# Patient Record
Sex: Female | Born: 1970 | Race: White | Hispanic: No | Marital: Married | Smoking: Former smoker
Health system: Southern US, Community
[De-identification: ages and names within clinical notes are randomized; demographics above are authoritative.]

## PROBLEM LIST (undated history)

## (undated) DIAGNOSIS — G709 Myoneural disorder, unspecified: Secondary | ICD-10-CM

## (undated) DIAGNOSIS — I499 Cardiac arrhythmia, unspecified: Secondary | ICD-10-CM

## (undated) DIAGNOSIS — R002 Palpitations: Secondary | ICD-10-CM

## (undated) DIAGNOSIS — N3020 Other chronic cystitis without hematuria: Secondary | ICD-10-CM

## (undated) DIAGNOSIS — Z5189 Encounter for other specified aftercare: Secondary | ICD-10-CM

## (undated) DIAGNOSIS — M1990 Unspecified osteoarthritis, unspecified site: Secondary | ICD-10-CM

## (undated) DIAGNOSIS — G47419 Narcolepsy without cataplexy: Secondary | ICD-10-CM

## (undated) DIAGNOSIS — M797 Fibromyalgia: Secondary | ICD-10-CM

## (undated) DIAGNOSIS — M5126 Other intervertebral disc displacement, lumbar region: Secondary | ICD-10-CM

## (undated) DIAGNOSIS — F419 Anxiety disorder, unspecified: Secondary | ICD-10-CM

## (undated) HISTORY — DX: Encounter for other specified aftercare: Z51.89

## (undated) HISTORY — PX: FRACTURE SURGERY: SHX138

## (undated) HISTORY — PX: LASIK: SHX215

## (undated) HISTORY — DX: Anxiety disorder, unspecified: F41.9

## (undated) HISTORY — DX: Other intervertebral disc displacement, lumbar region: M51.26

## (undated) HISTORY — DX: Narcolepsy without cataplexy: G47.419

## (undated) HISTORY — PX: AUGMENTATION MAMMAPLASTY: SUR837

## (undated) HISTORY — DX: Fibromyalgia: M79.7

## (undated) HISTORY — PX: TONSILLECTOMY: SUR1361

## (undated) HISTORY — DX: Myoneural disorder, unspecified: G70.9

## (undated) HISTORY — PX: COLONOSCOPY: SHX174

## (undated) HISTORY — DX: Palpitations: R00.2

## (undated) HISTORY — DX: Other chronic cystitis without hematuria: N30.20

---

## 2006-02-14 HISTORY — PX: AUGMENTATION MAMMAPLASTY: SUR837

## 2014-09-22 ENCOUNTER — Encounter (INDEPENDENT_AMBULATORY_CARE_PROVIDER_SITE_OTHER): Payer: Self-pay | Admitting: Neurology

## 2014-09-22 ENCOUNTER — Ambulatory Visit (INDEPENDENT_AMBULATORY_CARE_PROVIDER_SITE_OTHER): Admitting: Neurology

## 2014-09-22 ENCOUNTER — Encounter: Payer: Self-pay | Admitting: Hospital

## 2014-09-22 VITALS — BP 123/80 | HR 72 | Ht 63.8 in | Wt 114.0 lb

## 2014-09-22 DIAGNOSIS — G4711 Idiopathic hypersomnia with long sleep time: Principal | ICD-10-CM

## 2014-09-22 MED ORDER — SODIUM OXYBATE 500 MG/ML OR SOLN
3.75 mg | ORAL | Status: DC
Start: ? — End: 2014-09-22

## 2014-09-22 MED ORDER — SODIUM OXYBATE 500 MG/ML OR SOLN
3.7500 mg | Freq: Two times a day (BID) | ORAL | 5 refills | Status: DC
Start: 2014-09-22 — End: 2014-11-27

## 2014-09-22 MED ORDER — DULOXETINE HCL 20 MG OR CPEP: 120.00 mg | ORAL_CAPSULE | Freq: Every day | ORAL | Status: AC

## 2014-09-22 NOTE — Progress Notes (Signed)
Subjective:   April Cherry is a 44 year old female who is here for Sleep Problem      HPI    The patient has idiopathic hypersomnia.  At last visit she was taking Xyrem 3.75 mg twice a day and doing well with therapy.  She reported improved sleep quality and daytime alertness with this medication.  She was diagnosed with this problem about 10 years ago.    She also has fibromyalgia and attributes her disrupted sleep properly due to pain from fibromyalgia.    No gasping, choking, apneic events at night.  No significant tiredness durring the day.  No abnormal behaviors at night.  She does have mild sleep disruption at night.  Scored 5 on ESS.    Review of Systems   Constitutional: Positive for fatigue.   Eyes: Negative for visual disturbance.   Respiratory: Negative for apnea and choking.    Genitourinary: Negative for difficulty urinating.   Hematological: Does not bruise/bleed easily.   Psychiatric/Behavioral: The patient is not hyperactive.         Current Outpatient Prescriptions   Medication Sig Dispense Refill   . DULoxetine (CYMBALTA) 20 MG capsule Take 20 mg by mouth daily.     . Sodium Oxybate (XYREM) 500 MG/ML SOLN 3.75 mg.       No current facility-administered medications for this visit.      Allergies   Allergen Reactions   . Sulfa Drugs Unspecified       Reviewed patients pertinent information related to social history, past medical, past surgical, and family history.     Objective:  Vital signs: BP 123/80  Pulse 72  Ht 5' 3.8" (1.621 m)  Wt 51.7 kg (114 lb)  LMP 09/22/2014  SpO2 96%  BMI 19.69 kg/m2    HEENT:  MP class III soft palate.  Neck:  Supple, no thyromegally  CV:  RR, no murmers noted.  Pulm:  CTA B  Neurologic Exam     Mental Status   Oriented to person, place, and time.   Attention: normal. Concentration: normal.   Speech: speech is normal   Level of consciousness: alert  Knowledge: good.   Normal comprehension.     Cranial Nerves   Cranial nerves II through XII intact.     Motor Exam          Strength   Strength 5/5 throughout.     Sensory Exam   Right arm light touch: normal  Left arm light touch: normal  Right leg light touch: normal  Left leg light touch: normal  Vibration normal.     Gait, Coordination, and Reflexes     Gait  Gait: normal    Coordination   Finger to nose coordination: normal    Tremor   Resting tremor: absent  Intention tremor: absent  Action tremor: absent    Reflexes   Right brachioradialis: 2+  Left brachioradialis: 2+  Right biceps: 2+  Left biceps: 2+  Right triceps: 2+  Left triceps: 2+  Right patellar: 2+  Left patellar: 2+  Right achilles: 2+  Left achilles: 2+  Right grip: 2+  Left grip: 2+  Right plantar: normal  Left plantar: normal      Assessment/Plan:  April Cherry was seen today for Sleep Problem   She has idiopthic hypersomnia which is well treated with Xyrem.  She reports no side effects from this medication.    I recommend the following:    1.  Continue Xyrem  3.75 gms BID  2.  Schedule an attended diagnostic sleep study to evaluate for PLMS and OSA  as she continues to have disrupted sleep.  3.  F/U with me in 6 months    Diagnosed with:    ICD-10-CM ICD-9-CM    1. Idiopathic hypersomnia G47.11 780.54      No orders of the defined types were placed in this encounter.

## 2014-09-22 NOTE — Patient Instructions (Signed)
April Cherry was seen today for Sleep Problem   She has idiopthic hypersomnia which is well treated with Xyrem.  She reports no side effects from this medication.    I recommend the following:    1.  Continue Xyrem 3.75 gms BID  2.  Schedule an attended diagnostic sleep study to evaluate for PLMS and OSA  as she continues to have disrupted sleep.  3.  F/U with me in 6 months

## 2014-09-23 ENCOUNTER — Telehealth (INDEPENDENT_AMBULATORY_CARE_PROVIDER_SITE_OTHER): Payer: Self-pay

## 2014-09-23 NOTE — Telephone Encounter (Signed)
Auth requested for sleep study and ov fu

## 2014-10-31 ENCOUNTER — Ambulatory Visit (INDEPENDENT_AMBULATORY_CARE_PROVIDER_SITE_OTHER)

## 2014-10-31 DIAGNOSIS — G4761 Periodic limb movement disorder: Secondary | ICD-10-CM

## 2014-10-31 DIAGNOSIS — G4711 Idiopathic hypersomnia with long sleep time: Principal | ICD-10-CM

## 2014-11-04 NOTE — Procedures (Signed)
 COMPREHENSIVE POLYSOMNOGRAM CLINICAL REPORT  Diagnostic Polysomnogram [phone])    Patient Data  Name: April Cherry Referring Physician: [facility]   Age: 44 y Interpreting Physician: Jerry Caras, MD   Sex: Female Height: 5\' 4"  Epworth: 5   DOB: May 13, 1970 Weight: 114.0 lbs. BMI: 19.7     Neck Size: 13 inches      Sleep Study Information  Date of Study: 10/31/2014 Acquisition Technician: A. Rochells, RPSGT   Date of Interpretation: 11/03/2014 Scoring Technician: A. Rochells, RPSGT     Study Start Time: 10:36 PM       SUMMARY AND CONCLUSIONS:    Impression:      1. This study does not show significant sleep disordered breathing or obstructive sleep apnea.  Snoring was observed, the AHI was 1.2, and the lowest desaturation was to 92.0%.     2. Sleep was fragmented with 17 brief mostly spontaneous awakenings overnight resulting in a slightly reduced sleep efficiency of 89.2%.      3. Periodic Leg Movements of Sleep (PLMS) occurred at a rate of 50.2 per hour (normal < 15/hr). The majority of these were not disruptive of sleep with a cortical arousals with an arousal of 3.3 per hour.      Recommendations:    Based on the results of this study, I believe the patient would benefit from continued treatment with Xyrem.  She may benefit from a treatment trial of PLMS with low dose dopamine agonist (such as Requip or Mirapex 0.25-0.5 mg qhs) as there can be night to night variability in how disruptive PLMS is to sleep.  I will discuss the study results and further care with April Cherry at her next office visit.                   CLINICAL INFORMATION:    Referral Reason:  Disturbed sleep, frequent awakenings at night.     History:  Idiopathic hypersomnia, fibromyalgia    Medications:  Cymbalta    Sleep Medications:  Xyrem    Post sleep study questionnaire:  On the morning following the study, the patient completed a questionnaire, reporting that sleep the night of the study was about the same as a typical  night's sleep.    Technician's comments:  Scoring Technician: A. Rochells, RPSGT    Kilah was scheduled for diagnostic sleep study for disturbed sleep. Light to moderate snoring was noted. No clinically significant Obstructive Apnea/Hypopnea was observed. The lowest Oxygen saturation was 92%.  Sleep efficiency was mildly reduced at 89.2% due to a long latency to sleep onset as well as several arousals and episodes of wake after sleep onset. Frequent PLMS were noted which were somewhat disruptive of sleep. EKG showed normal sinus rhythm. Nocturia 0X.      POLYSOMNOGRAPHY DETAILS  Study set?up and analysis:    An overnight attended polysomnogram was performed using the [facility] polysomnographic recording system. The following parameters were measured: Electroencephalogram (F3, O1, O2, C3, C4, derivations), right and left Electro0oculogram, Electromyogram (Chin and tibialis anterior), oral/nasal air flow (thermistor and pressure transducer), respiratory effort (thoracic and abdominal piezoelectric bands), snoring detection microphone, pulse oximetry, Electrocardiogram (Standard limb lead 2), and position sensor. The international 10?20 EEG electrode placement system was used. Standard impedance and biocalibration checks were performed. The Electroencephalogram was analyzed using AASM criteria. Respiratory events were scored from the flow or effort channels if the duration was 10 seconds or more. Hypopneas were scored following guidelines provided by the  American Academy  of Sleep Medicine and were defined as decrements in airflow greater than or equal to 30% but less than 90% from the immediate baseline associated with an oxygen desaturation of 3%  or more or an EEG arousal. Apneas were scored when the air flow decreased by 90% or more regardless of associated conditions. The apnea hypopnea index (AHI) was calculated using the total sleep time as the denominator.  Periodic limb movements  and arousals were scored following standard criteria. The entire recording was reviewed and interpreted by a board certified sleep disorders specialist.    Quality of data: The patient tolerated the polysomnography procedure well. Excellent data quality was obtained. No complications noted.    Sleep parameters: Total sleep time was 395.5 minutes.  Sleep efficiency was 89.2%.  Sleep latency to stage 1 sleep was 0 minutes. REM sleep latency was 0 minutes.   Times and Durations  Lights Off (LOFF): 10:45:14 PM   Sleep Onset (SO): 26.4   REM Latency (from SO): 0   Lights On: 06:08:48 AM     Total Recording Time (TIB): 443.6   Sleep Period Time (SPT): 417.7   Total Sleep Time (TST): 395.5     REM Duration: 0   NREM Duration: 395.5   Slow Wave Sleep Duration: 279.0     WASO: 22.0   Total Wake Time (TWK): 48.5       Sleep architecture: Stage 1 sleep at 8.7% (normal < 5%), Stage 2 sleep at 20.7%, Slow Wave Sleep at 70.5%. REM sleep was at 0%.  There were 0 REM periods totaling 0 of sleep. Wake time percent was 10.9% of total time in bed.  Electroencephalogram was within normal limits. There were no abnormal body movement events or vocalizations observed that were suggestive or a REM or NREM parasomnia. No abnormal behaviors observed overnight or during sleep. There were no suggestions of abnormal spike and wave on the EEG that suggests ictal (seizure) activities.    Sleep Staging Data  Sleep Stage Latency   (from lights off) % of TST Durations #Periods   N1 26.4 8.7% 34.5    N2 33.9  20.7% 82.0    N3 0 70.5% 279.0    R 0 0% 0 0     Sleep Efficiency: 89.2%      Body position analysis: The patient spent 0 minutes of the total sleep time in the supine position.  Body Position (entire night)  Position Duration  (min) Sleep (min) REM  (min) NREM (min)   (#) OA  (#) MA  (#) HYP  (#) AHI  (#/hr) Desats  (#)   L 314.6 286.5 0 286.5 3 2  0 2 1.5 0   P 0 0 0 0 0 0 0 0 0 0    S 0 0 0 0 0 0 0 0 0 0    R 129.0 109.0 0 109.0 0 0 0 1 0.6 0     Up 0 0 0 0 0 0 0 0 0 0      Arousal analysis: Total arousal index was 11.8.  Arousal Data   Count Index   Respiratory: 2 0.3   Leg Movements: 26 3.9   Snoring: 0 0   Spontaneous 51 7.7   Total arousals: 78 11.8     Leg movement analysis: 331 periodic limb movements were scored with an index of 50.2 per hour.   Leg Movements   Count Index   Total Leg Movements: 358 54.3   PLMS:  331 50.2   PLMS Arousals: 22 3.3       Respiratory analysis: Total AHI was at 1.2.  REM related AHI was 0. Supine AHI 0.  Cheyne Stokes Breathing: Was not observed during study.    Respiratory Data   [place] OA MA Total Apnea Hypopnea* Apneas + Hypopneas   Number 3 2 0 5 3 8    Index (#/h TST) 0.5 0.3 0 0.8 0.5 1.2   *Hypopneas scored based on 4% or greater desaturation     REM NREM TST   AHI 0 1.2 1.2       Oxygenation and heart rate analysis: Average sleep saturation was 96.6%. Nadir saturation was 92.0%. Desaturation index was 0.  EKG showed normal sinus rhythm during sleep.  The mean heart rate was 68.9.  The maximum heart rate was 103.1, the minimum rate was 57.9. Atrial fibrillation was not observed. No other cardiac arrhythmias were observed.    Range(%) Time in range (min) Time in range (%)   0 - 90.0 0 0%   0 - 88.0 0 0%     Oximetry Summary   Wake REM NREM TOTAL   Number of Desaturations* 0 0 0 0   Desat Index (#/hour) 0 0 0 0   *Desaturations based on  or greater drop from baseline.  Minimum SpO2 value during sleep: 92.0%   Minimum SpO2 value associated with a respiratory event: 92%

## 2014-11-27 ENCOUNTER — Ambulatory Visit (INDEPENDENT_AMBULATORY_CARE_PROVIDER_SITE_OTHER): Admitting: Neurology

## 2014-11-27 ENCOUNTER — Encounter: Payer: Self-pay | Admitting: Hospital

## 2014-11-27 ENCOUNTER — Encounter (INDEPENDENT_AMBULATORY_CARE_PROVIDER_SITE_OTHER): Payer: Self-pay | Admitting: Neurology

## 2014-11-27 VITALS — BP 113/78 | HR 98 | Ht 64.0 in | Wt 114.0 lb

## 2014-11-27 DIAGNOSIS — G4711 Idiopathic hypersomnia with long sleep time: Principal | ICD-10-CM

## 2014-11-27 DIAGNOSIS — G4761 Periodic limb movement disorder: Secondary | ICD-10-CM

## 2014-11-27 MED ORDER — SODIUM OXYBATE 500 MG/ML OR SOLN
ORAL | 5 refills | Status: DC
Start: 2014-11-27 — End: 2016-05-24

## 2014-11-27 NOTE — Patient Instructions (Signed)
Accordingly I recommend the following to improve sleep and daytime alertness.    1.  Increase Xyrem to 4.5 gms twise nightly.  2.  F/U with me in 3 months.  3.  If significant tiredness persists at next visit consider treatment trial trial with Requip or Mirapex to treat PLMS

## 2014-11-27 NOTE — Progress Notes (Signed)
Subjective:   April Cherry is a 44 year old female who is here for Recheck      HPI    April Cherry has idiopathic hypersomnia and disrupted sleep from fibromyalgia.  She is on Xyrem 3.75 mg twice a day.  At last visit she reported more daytime tiredness so accordingly I ordered a diagnostic sleep study.    April Cherry is here to follow up on a sleep study performed October 31, 2014..  This study showed periodic leg movements of sleep at a rate of 50.2 per hour (normal less than 15 per hour).  The majority of these were not disruptive of sleep with cortical arousal index of 3.3.  Sleep apnea was not observed as the AHI was 1.2 and nadir saturation to 92%.  Sleep was fragmented with 17 brief mostly spontaneous awakenings overnight resulting in a slightly reduced sleep efficiency of 89.2%.I discussed the study results with the patient at length.    Review of Systems   Constitutional: Positive for fatigue.   Eyes: Negative for visual disturbance.   Respiratory: Negative for apnea and choking.    Cardiovascular: Negative for leg swelling.   Gastrointestinal: Negative for blood in stool.   Genitourinary: Negative for hematuria.   Neurological: Negative for seizures and syncope.        Current Outpatient Prescriptions   Medication Sig Dispense Refill   . DULoxetine (CYMBALTA) 20 MG capsule Take 120 mg by mouth daily.       . Sodium Oxybate (XYREM) 500 MG/ML SOLN 4.5 gms twose nightly 1 bottle 5     No current facility-administered medications for this visit.      Allergies   Allergen Reactions   . Sulfa Drugs Unspecified       Reviewed patients pertinent information related to social history, past medical, past surgical, and family history.     Objective:  Vital signs: BP 113/78  Pulse 98  Ht 5\' 4"  (1.626 m)  Wt 51.7 kg (114 lb)  LMP  (LMP Unknown)  BMI 19.57 kg/m2    Gen.: Stable in no acute distress.    HEENT: Normocephalic.   PERRLA.  Mallampati class III soft palate.  Nasal exam normal.  Neck: Supple.  No thyromegaly  noted.  Cardiovascular exam: Regular rate and rhythm.  No murmurs noted.  Pulmonary exam: Clear to auscultation bilaterally.  Extremity exam: No clubbing cyanosis or edema.        Neurologic Exam     Mental Status   Oriented to person, place, and time.   Level of consciousness: alert  Knowledge: good and consistent with education.     Cranial Nerves     CN II   Visual acuity: normal  Right visual field deficit: none  Left visual field deficit: none     CN III, IV, VI   Pupils are equal, round, and reactive to light.  Extraocular motions are normal.   Right pupil: Size: 3 mm. Shape: regular. Reactivity: brisk. Consensual response: intact. Accommodation: intact.   Left pupil: Size: 3 mm. Shape: regular. Reactivity: brisk. Consensual response: intact. Accommodation: intact.   CN III: no CN III palsy  CN VI: no CN VI palsy  Nystagmus: none     CN V   Facial sensation intact.     CN VII   Facial expression full, symmetric.     CN VIII   CN VIII normal.     CN IX, X   CN IX normal.     CN  XI   CN XI normal.     CN XII   CN XII normal.     Motor Exam   Muscle bulk: normal  Overall muscle tone: normal  Right arm tone: normal  Left arm tone: normal  Right arm pronator drift: absent  Left arm pronator drift: absent  Right leg tone: normal  Left leg tone: normal    Strength   Strength 5/5 throughout.     Sensory Exam   Light touch normal.   Vibration normal.     Gait, Coordination, and Reflexes     Gait  Gait: normal    Coordination   Romberg: negative    Tremor   Resting tremor: absent  Intention tremor: absent  Action tremor: absent    Reflexes   Right brachioradialis: 2+  Left brachioradialis: 2+  Right biceps: 2+  Left biceps: 2+  Right triceps: 2+  Left triceps: 2+  Right patellar: 2+  Left patellar: 2+  Right achilles: 2+  Left achilles: 2+  Right grip: 2+  Left grip: 2+  Right plantar: normal  Left plantar: normal  Right ankle clonus: absent  Left ankle clonus: absent  Right pendular knee jerk: absent  Left pendular  knee jerk: absent      Assessment/Plan:  April Cherry Has 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  Her recent sleep study demonstrates periodic leg movements of sleep.  Though these were not greatly disruptive the study night there can be night to night variability and how these disrupt sleep.      Accordingly I recommend the following to improve sleep and daytime alertness.    1.  Increase Xyrem to 4.5 gms twise nightly.  2.  F/U with me in 3 months.  3.  If significant tiredness persists at next visit consider treatment trial trial with Requip or Mirapex to treat PLMS      Diagnosed with:    ICD-10-CM ICD-9-CM    1. Idiopathic hypersomnia G47.11 780.54 Sodium Oxybate (XYREM) 500 MG/ML SOLN   2. Periodic limb movements of sleep G47.61 327.51      No orders of the defined types were placed in this encounter.

## 2014-11-27 NOTE — Interdisciplinary (Signed)
I, Metro Kung, roomed, verified all medications and allergies with the patient.       This patient uses the lab located on the [place].

## 2014-12-01 ENCOUNTER — Telehealth (INDEPENDENT_AMBULATORY_CARE_PROVIDER_SITE_OTHER): Payer: Self-pay | Admitting: Neurology

## 2014-12-01 NOTE — Telephone Encounter (Signed)
Faxed xyrem forms Friday forgot to document rcvd comfirmation from xyrem this morning//AT

## 2015-03-04 ENCOUNTER — Encounter (INDEPENDENT_AMBULATORY_CARE_PROVIDER_SITE_OTHER): Admitting: Neurology

## 2015-04-16 ENCOUNTER — Ambulatory Visit (INDEPENDENT_AMBULATORY_CARE_PROVIDER_SITE_OTHER): Admitting: Neurology

## 2015-04-16 VITALS — BP 96/61 | HR 71 | Ht 64.0 in | Wt 111.0 lb

## 2015-04-16 DIAGNOSIS — G4761 Periodic limb movement disorder: Secondary | ICD-10-CM

## 2015-04-16 DIAGNOSIS — G4711 Idiopathic hypersomnia with long sleep time: Principal | ICD-10-CM

## 2015-04-16 NOTE — Progress Notes (Signed)
Subjective:   Camile Esters is a 45 year old female who is here for Recheck      HPI    Sitara Cashwell has 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 She underwent  A sleep study and September 2016 that demonstrated periodic limb movements of sleep though these were not greatly disruptive of sleep.    At last visit I increased her dose of Xyrem to 4.5 g twice nightly to improve sleep quality and daytime alertness.this medication at the higher dose was more effective and she reports feeling more alert during the day.  She generally does not nap.  She scores 4 on the Epworth Sleepiness Scale at today's visit which is within normal limits.  No side effects from Xyrem reported.she does not mix this medication with alcohol.    She is looking forward to seeing her grandchildren in [place].  She also has 2 grandchildren which live in the area here.    Review of Systems   Constitutional: Negative for fatigue.   Respiratory: Negative for apnea.    Gastrointestinal: Negative for blood in stool.   Genitourinary: Negative for hematuria.   Neurological: Negative for syncope.   Hematological: Does not bruise/bleed easily.        Current Outpatient Prescriptions   Medication Sig Dispense Refill   . DULoxetine (CYMBALTA) 20 MG capsule Take 120 mg by mouth daily.       . Sodium Oxybate (XYREM) 500 MG/ML SOLN 4.5 gms twose nightly 1 bottle 5     No current facility-administered medications for this visit.      Allergies   Allergen Reactions   . Sulfa Drugs Unspecified       Reviewed patients pertinent information related to social history, past medical, past surgical, and family history.     Objective:  Vital signs: BP 96/61  Pulse 71  Ht 5\' 4"  (1.626 m)  Wt 50.3 kg (111 lb)  BMI 19.05 kg/m2    Gen.: Stable in no acute distress.    HEENT: Normocephalic.   PERRLA.  Mallampati class III soft palate.  Nasal exam normal.  Neck: Supple.  No  thyromegaly noted.  Cardiovascular exam: Regular rate and rhythm.  No murmurs noted.  Pulmonary exam: Clear to auscultation bilaterally.  Extremity exam: No clubbing cyanosis or edema.      Neurologic Exam     Mental Status   Oriented to person, place, and time.   Level of consciousness: alert  Knowledge: good and consistent with education.     Cranial Nerves     CN II   Visual acuity: normal  Right visual field deficit: none  Left visual field deficit: none     CN III, IV, VI   Pupils are equal, round, and reactive to light.  Extraocular motions are normal.   Right pupil: Size: 3 mm. Shape: regular. Reactivity: brisk. Consensual response: intact. Accommodation: intact.   Left pupil: Size: 3 mm. Shape: regular. Reactivity: brisk. Consensual response: intact. Accommodation: intact.   CN III: no CN III palsy  CN VI: no CN VI palsy  Nystagmus: none     CN V   Facial sensation intact.     CN VII   Facial expression full, symmetric.     CN VIII   CN VIII normal.     CN IX, X   CN IX normal.     CN XI   CN XI normal.  CN XII   CN XII normal.     Motor Exam   Muscle bulk: normal  Overall muscle tone: normal  Right arm tone: normal  Left arm tone: normal  Right arm pronator drift: absent  Left arm pronator drift: absent  Right leg tone: normal  Left leg tone: normal    Strength   Strength 5/5 throughout.     Sensory Exam   Light touch normal.   Vibration normal.     Gait, Coordination, and Reflexes     Gait  Gait: normal    Coordination   Romberg: negative    Tremor   Resting tremor: absent  Intention tremor: absent  Action tremor: absent    Reflexes   Right brachioradialis: 2+  Left brachioradialis: 2+  Right biceps: 2+  Left biceps: 2+  Right triceps: 2+  Left triceps: 2+  Right patellar: 2+  Left patellar: 2+  Right achilles: 2+  Left achilles: 2+  Right grip: 2+  Left grip: 2+  Right plantar: normal  Left plantar: normal  Right ankle clonus: absent  Left ankle clonus: absent  Right pendular knee jerk: absent  Left  pendular knee jerk: absent    Records reviewed: I reviewed notes in the chart.    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  She is able to maintain normal alertness with this medication.  She does not nap routinely with this medication.    I recommend the following:    1.  Continue Xyrem 4.5 gms BID.  We discussed the importance of not mixing this medication with alcohol.  2.  F/U with me in 6 months.      Risks associated with driving while drowsy were discussed with the patient who agrees to take the appropriate measures to eliminate these risks.    Diagnosed with:    ICD-10-CM ICD-9-CM    1. Idiopathic hypersomnia G47.11 780.54    2. Periodic limb movements of sleep G47.61 327.51      No orders of the defined types were placed in this encounter.  Electronically signed by: Jerry Caras, MD   Signature Derived From Controlled Access Password, April 16, 2015, 3:54 PM

## 2015-04-16 NOTE — Patient Instructions (Addendum)
You have idiopathic hypersomnia which is well treated with Xyrem.    I recommend the following:    1.  Continue Xyrem 4.5 gms BID.  We discussed the importance of not mixing this medication with alcohol.  2.  F/U with me in 6 months.      Risks associated with driving while drowsy were discussed with the patient who agrees to take the appropriate measures to eliminate these risks.

## 2015-04-30 ENCOUNTER — Telehealth (INDEPENDENT_AMBULATORY_CARE_PROVIDER_SITE_OTHER): Payer: Self-pay | Admitting: Neurology

## 2015-04-30 DIAGNOSIS — G4711 Idiopathic hypersomnia with long sleep time: Principal | ICD-10-CM

## 2015-04-30 MED ORDER — SODIUM OXYBATE 500 MG/ML OR SOLN
ORAL | 5 refills | Status: DC
Start: 2015-04-30 — End: 2015-11-05

## 2015-04-30 MED ORDER — SODIUM OXYBATE 500 MG/ML OR SOLN
ORAL | Status: DC
Start: 2014-04-10 — End: 2015-11-05

## 2015-04-30 NOTE — Telephone Encounter (Signed)
Dr Wellington Hampshire-  The  [facility] pharmacy is calling and requesting refill on Zyrem 500mg /per ml 4.grams 2 times a night.    Please advise//as    [phone]  # 3 and #4  Bonita Quin

## 2015-04-30 NOTE — Telephone Encounter (Signed)
I will refill the medication.  Gave a paper copy to [facility], my medical assistant

## 2015-05-01 NOTE — Telephone Encounter (Signed)
Faxed zyrem form to pharmacy for patient.//as

## 2015-10-12 ENCOUNTER — Telehealth (INDEPENDENT_AMBULATORY_CARE_PROVIDER_SITE_OTHER): Payer: Self-pay | Admitting: Neurology

## 2015-10-12 NOTE — Telephone Encounter (Signed)
10/12/15 Faxed Xyrem prescription to [facility] at fax number [phone].//spt

## 2015-10-22 ENCOUNTER — Encounter (INDEPENDENT_AMBULATORY_CARE_PROVIDER_SITE_OTHER): Admitting: Neurology

## 2015-11-05 ENCOUNTER — Encounter (INDEPENDENT_AMBULATORY_CARE_PROVIDER_SITE_OTHER): Payer: Self-pay | Admitting: Neurology

## 2015-11-05 ENCOUNTER — Ambulatory Visit (INDEPENDENT_AMBULATORY_CARE_PROVIDER_SITE_OTHER): Admitting: Neurology

## 2015-11-05 VITALS — BP 113/76 | HR 69 | Ht 63.0 in | Wt 113.2 lb

## 2015-11-05 DIAGNOSIS — G4711 Idiopathic hypersomnia with long sleep time: Principal | ICD-10-CM

## 2015-11-05 DIAGNOSIS — G4761 Periodic limb movement disorder: Secondary | ICD-10-CM

## 2015-11-05 NOTE — Interdisciplinary (Signed)
[name] Kitchen  April Cherry Roomed, reviewed all medications and allergies with patient.

## 2015-11-05 NOTE — Progress Notes (Signed)
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    She benefits from this medication.  No tiredness or grogginess from it.  She is much more functional on this medication.  wwhen she was off this medication she had difficulty performing most of her activities of daily living.  She is sleeping from 9:30 PM and awakening at 5:25 AM.  No problems with insomnia on medication.she is not mixing this medication with alcohol.    Her alertness is improved with medication.  The medication she scores 3 on the Epworth sleepiness scale.    No gasping, choking, or loud snoring at night on Xyrem.no obvious symptoms of obstructive sleep apnea.   The her prior sleep study showed periodic limb movements of sleep she does not have typical features of periodic limb movement disorder.    Review of Systems   Constitutional: Negative for fatigue.   Eyes: Negative for discharge.   Respiratory: Negative for apnea.    Gastrointestinal: Negative for blood in stool.   Genitourinary: Negative for hematuria.   Neurological: Negative for syncope.   Hematological: Does not bruise/bleed easily.   Psychiatric/Behavioral: Negative for dysphoric mood.        Current Outpatient Prescriptions   Medication Sig Dispense Refill   . DULoxetine (CYMBALTA) 20 MG capsule Take 120 mg by mouth daily.       . Sodium Oxybate (XYREM) 500 MG/ML SOLN 4.5 gms twose nightly 1 bottle 5     No current facility-administered medications for this visit.      Allergies   Allergen Reactions   . Sulfa Drugs Unspecified       Reviewed patients pertinent information related to social history, past medical, past surgical, and family history.     Objective:  Vital signs: BP 113/76 (BP cuff site: Left, BP Patient Position: Sitting, BP cuff size: Regular)   Pulse 69  Ht 5\' 3"  (1.6 m)  Wt 51.3 kg (113 lb 3.2 oz)  LMP 10/21/2015  BMI 20.05 kg/m2    Gen.: Stable in no acute distress.    HEENT: Normocephalic.   PERRLA.  Mallampati class III soft palate.  Nasal exam normal.  Neck: Supple.  No thyromegaly noted.  Cardiovascular exam: Regular rate and rhythm.  No murmurs noted.  Pulmonary exam: Clear to auscultation bilaterally.  Extremity exam: No clubbing cyanosis or edema.        Neurologic Exam     Mental Status   Oriented to person, place, and time.   Attention: normal. Concentration: normal.   Speech: speech is normal   Level of consciousness: alert  Knowledge: good and consistent with education.   Normal comprehension.     Cranial Nerves     CN II   Visual acuity: normal  Right visual field deficit: none  Left visual field deficit: none     CN III, IV, VI   Pupils are equal, round, and reactive to light.  Extraocular motions are normal.   Right pupil: Size: 3 mm. Shape: regular. Reactivity: brisk. Consensual response: intact. Accommodation: intact.   Left pupil: Size: 3 mm. Shape: regular. Reactivity: brisk. Consensual response: intact. Accommodation: intact.   CN III: no CN III palsy  CN VI: no CN VI  palsy  Nystagmus: none     CN V   Facial sensation intact.     CN VII   Facial expression full, symmetric.     CN VIII   CN VIII normal.     CN IX, X   CN IX normal.     CN XI   CN XI normal.     CN XII   CN XII normal.     Motor Exam   Muscle bulk: normal  Overall muscle tone: normal  Right arm tone: normal  Left arm tone: normal  Right arm pronator drift: absent  Left arm pronator drift: absent  Right leg tone: normal  Left leg tone: normal    Strength   Strength 5/5 throughout.     Sensory Exam   Light touch normal.   Vibration normal.     Gait, Coordination, and Reflexes     Gait  Gait: normal    Coordination   Romberg: negative  Finger to nose coordination: normal  Tandem walking coordination: normal    Tremor   Resting tremor: absent  Intention tremor:  absent  Action tremor: absent    Reflexes   Right brachioradialis: 2+  Left brachioradialis: 2+  Right biceps: 2+  Left biceps: 2+  Right triceps: 2+  Left triceps: 2+  Right patellar: 2+  Left patellar: 2+  Right achilles: 2+  Left achilles: 2+  Right plantar: normal  Left plantar: normal  Right ankle clonus: absent  Left ankle clonus: absent  Right pendular knee jerk: absent  Left pendular knee jerk: absent    I reviewed notes in the EMR including her prior sleep study.      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  Her neurological examination is normal and stable.    I recommend the following:    1.  Continue Xyrem 4.5 gms BID nightly.  2.  Do not mix Xyrem with Alcohol, discussed risks.  3.  F/U with me in 6 months.        Diagnosed with:    ICD-10-CM ICD-9-CM    1. Idiopathic hypersomnia G47.11 780.54    2. Periodic limb movements of sleep G47.61 327.51      No orders of the defined types were placed in this encounter.  Visit details reviewed and approved by supervising provider Jerry Caras, MD on 11/05/2015, 2:55 PM    Electronically signed by: Jerry Caras, MD on 11/05/2015, 2:55 PM

## 2015-11-05 NOTE — Patient Instructions (Signed)
I recommend the following:    1.  Continue Xyrem 4.5 gms BID nightly.    2.  Do not mix Xyrem with Alcohol, discussed risks.    3.  F/U with me in 6 months.

## 2016-03-31 ENCOUNTER — Telehealth (INDEPENDENT_AMBULATORY_CARE_PROVIDER_SITE_OTHER): Payer: Self-pay | Admitting: Neurology

## 2016-03-31 NOTE — Telephone Encounter (Signed)
03/30/16 Faxed Xyrem rx to [facility] to fax [phone].//spt

## 2016-04-14 NOTE — Telephone Encounter (Signed)
Patient called and states she has not received the rx Xyrem.  She states the rx has cld [place] twice as they need clarification on rx.  Can you please call [facility]?

## 2016-05-12 NOTE — Telephone Encounter (Signed)
Spoke to patient and explained the situation. Will FU with her once pharmacy is contacted.//spt

## 2016-05-19 ENCOUNTER — Encounter (INDEPENDENT_AMBULATORY_CARE_PROVIDER_SITE_OTHER): Payer: Self-pay

## 2016-05-19 ENCOUNTER — Encounter (INDEPENDENT_AMBULATORY_CARE_PROVIDER_SITE_OTHER): Admitting: Neurology

## 2016-05-20 NOTE — Telephone Encounter (Addendum)
05/20/16 Judeth Cornfield and Erica pls follow up next week, auth should be coming from int fax. ///MCE

## 2016-05-20 NOTE — Telephone Encounter (Signed)
05/20/16 [facility] is requiring PA for this medication, PA form was received after multiple attempts and completed today and faxed back with latest OV note and fax cover page to [phone], pt notified. ///MCE

## 2016-05-24 ENCOUNTER — Telehealth (INDEPENDENT_AMBULATORY_CARE_PROVIDER_SITE_OTHER): Payer: Self-pay | Admitting: Physician Assistant

## 2016-05-24 DIAGNOSIS — G4711 Idiopathic hypersomnia with long sleep time: Secondary | ICD-10-CM

## 2016-05-24 DIAGNOSIS — G4712 Idiopathic hypersomnia without long sleep time: Principal | ICD-10-CM

## 2016-05-24 NOTE — Telephone Encounter (Addendum)
05/24/16 Faxed rx to [facility] to fax number [phone] and also the approval for the medicaiton.//spt

## 2016-05-24 NOTE — Telephone Encounter (Signed)
05/24/16 placed rx on [name]'s desk for him to write up script.//spt

## 2016-05-25 MED ORDER — SODIUM OXYBATE 500 MG/ML OR SOLN
ORAL | 3 refills | Status: DC
Start: 2016-05-25 — End: 2016-12-30

## 2016-05-25 NOTE — Telephone Encounter (Signed)
Rx done. 

## 2016-05-27 ENCOUNTER — Ambulatory Visit (INDEPENDENT_AMBULATORY_CARE_PROVIDER_SITE_OTHER): Admitting: Physician Assistant

## 2016-05-27 ENCOUNTER — Encounter (INDEPENDENT_AMBULATORY_CARE_PROVIDER_SITE_OTHER): Payer: Self-pay | Admitting: Physician Assistant

## 2016-05-27 VITALS — BP 126/75 | HR 82 | Ht 64.0 in | Wt 114.0 lb

## 2016-05-27 DIAGNOSIS — G4761 Periodic limb movement disorder: Principal | ICD-10-CM

## 2016-05-27 DIAGNOSIS — G4711 Idiopathic hypersomnia with long sleep time: Secondary | ICD-10-CM

## 2016-05-27 MED ORDER — CEPHALEXIN 250 MG OR CAPS
ORAL_CAPSULE | ORAL | Status: AC
Start: 2016-05-05 — End: ?

## 2016-05-27 NOTE — Progress Notes (Signed)
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    She benefits from this medication.  No tiredness or grogginess from it.  She is much more functional on this medication.  wwhen she was off this medication she had difficulty performing most of her activities of daily living.  She is sleeping from 9:30 PM and awakening at 5:25 AM.  No problems with insomnia on medication.she is not mixing this medication with alcohol.    Her alertness is improved with medication.  The medication she scores 3 on the Epworth sleepiness scale.    No gasping, choking, or loud snoring at night on Xyrem.no obvious symptoms of obstructive sleep apnea.   The her prior sleep study showed periodic limb movements of sleep she does not have typical features of periodic limb movement disorder.    ALLERGIES:  Sulfa drugs    MEDICATIONS:    Current Outpatient Prescriptions:   .  cephALEXin (KEFLEX) 250 MG capsule, , Disp: , Rfl:   .  DULoxetine (CYMBALTA) 20 MG capsule, Take 120 mg by mouth daily.  , Disp: , Rfl:   .  Sodium Oxybate (XYREM) 500 MG/ML SOLN, 4.5 gms twice nightly, Disp: 3 bottle, Rfl: 3      REVIEW OF SYSTEMS:    Review of systems today reveals no problems referable to constitutional features, HEENT, cardiac, pulmonary, GI, GU, endocrine, dermatologic, musculoskeletal, neurologic, psychiatric, hematologic, allergic, or immunogenic areas other than those already established in the PMH or HPI.      VITAL SIGNS:    BP 126/75 (BP Location: Left arm, BP Patient Position: Sitting, BP cuff size: Regular)  Pulse 82  Ht 5\' 4"  (1.626 m)  Wt 51.7 kg (114 lb)  BMI 19.57 kg/m2      NEUROLOGICAL EXAM:  General: well-appearing, well-nourished, no acute distress  ENT:  hearing is grossly intact, Mallampati Class I soft palate  Neck: trachea is midline  Respiratory: normal respiratory effort, clear to auscultation bilaterally  Cardiovascular: regular rate and rhythm, normal S1 and S2  Skin: no rash noted  Neurologic: alert and oriented x 3, recent and remote memory are intact  Psychiatric: appropriate mood and affect    IMPRESSION/PLAN:      ICD-10-CM ICD-9-CM    1. Periodic limb movements of sleep G47.61 327.51    2. Idiopathic hypersomnia G47.11 780.54        No orders of the defined types were placed in this encounter.    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  Her neurological examination is normal and stable.    I recommend the following:    1.  Continue Xyrem 4.5 gms BID nightly.  2.  Do not mix Xyrem with Alcohol, discussed risks.  3.  F/U with me or Heywood Bene, PA in 9 months.    Her husband is in the [employer] and she may be moving later this year. When she knows she is moving, she is cleared for a 6 month refill which will allow her time to find a provider  who is able to prescribe Xyrem to establish care.     She shouldn't hesitate to contact the office sooner if needed.             Electronically signed by: Derrel Nip, PA on 05/27/2016, 3:27 PM       Encounter submitted for review by Derrel Nip, PA on 05/27/2016, 3:27 PM    This was a visit of 23 minutes. Greater than 50% of the time was spent counseling the patient face-to-face regarding disease management.

## 2016-05-27 NOTE — Interdisciplinary (Signed)
.  April Cherry roomed, reviewed all medications and allergies with patient.

## 2016-08-31 ENCOUNTER — Telehealth (INDEPENDENT_AMBULATORY_CARE_PROVIDER_SITE_OTHER): Payer: Self-pay | Admitting: Physician Assistant

## 2016-11-07 ENCOUNTER — Ambulatory Visit (INDEPENDENT_AMBULATORY_CARE_PROVIDER_SITE_OTHER): Payer: TRICARE For Life (TFL) | Admitting: Physician Assistant

## 2016-11-07 ENCOUNTER — Encounter: Payer: Self-pay | Admitting: Physician Assistant

## 2016-11-07 VITALS — BP 118/80 | HR 74 | Temp 98.3°F | Resp 16 | Ht 63.5 in | Wt 117.0 lb

## 2016-11-07 DIAGNOSIS — G47419 Narcolepsy without cataplexy: Secondary | ICD-10-CM | POA: Diagnosis not present

## 2016-11-07 DIAGNOSIS — Z1231 Encounter for screening mammogram for malignant neoplasm of breast: Secondary | ICD-10-CM | POA: Diagnosis not present

## 2016-11-07 DIAGNOSIS — Z87440 Personal history of urinary (tract) infections: Secondary | ICD-10-CM | POA: Diagnosis not present

## 2016-11-07 DIAGNOSIS — M797 Fibromyalgia: Secondary | ICD-10-CM | POA: Insufficient documentation

## 2016-11-07 DIAGNOSIS — Z1239 Encounter for other screening for malignant neoplasm of breast: Secondary | ICD-10-CM

## 2016-11-07 DIAGNOSIS — Z23 Encounter for immunization: Secondary | ICD-10-CM

## 2016-11-07 MED ORDER — CEPHALEXIN 250 MG PO CAPS: 250.0000 mg | ORAL_CAPSULE | Freq: Every day | ORAL | 1 refills | Status: DC

## 2016-11-07 MED ORDER — DULOXETINE HCL 60 MG PO CPEP: 60.0000 mg | ORAL_CAPSULE | Freq: Two times a day (BID) | ORAL | 1 refills | Status: DC

## 2016-11-07 NOTE — Progress Notes (Signed)
Patient ID: Heidi Gross MRN: [id number], DOB: Jan 29, 1971, 46 y.o. Date of Encounter: @  Chief Complaint:  Chief Complaint  Patient presents with  . New Patient (Initial Visit)    HPI: 46 y.o. year old female  presents as a New Patient to Establish Care.   She has recently been here to our office with multiple of her children, bringing them in for office visits to establish care. They recently moved here from [facility].  She is married. They have 2 daughters ages 50 and 59 years old.  They just moved here from [facility] on 09/19/2016.  Her husband has been Hotel manager so they have moved around over the years. She reports that he recently retired from [employer] and is now starting job at [employer]. She reports that they actually lived in [place] 15 years ago.   Today patient states that she needs a referral. States that she " is not used to the way Civilian things work-- that things work differently with the [employer]." States that she is on a very rare medication--- Rennis Harding that she takes this for narcolepsy/sleep disorder.----States that only very few pharmacies even have the medication and that only very few doctors are able to prescribe the medication. States that she has contacted the pharmaceutical company to find out which local doctors can prescribe it----2 names came up-----Dr. Porfirio Mylar Dohmeier and Dr. Huston Foley. She needs a referral so that she can see one of them for them to prescribe this medication.  She states that she also is on Cymbalta for fibromyalgia. Says that she in the past she went to a Rheumatologist and they diagnosed with fibromyalgia and prescribed the Cymbalta. After that, her PCP continued to prescribe the Cymbalta. Is asking for me to continue to refill this medication.  She also reports that she takes Keflex on a daily basis secondary to history of recurrent UTIs. In the past she saw Urology and they did extensive testing and have  told her to continue to stay on Keflex daily as preventative.  She does report that just recently over the last month she has noticed her neck feeling stiff and occasionally will feel pain that shoots up towards her head from the neck.  She reports that she had a full panel of labs done around June or July 2018 and these were all normal.  She reports that she is due for mammogram and is asking whether she can schedule this herself/ what the procedure is.  No other concerns to address today.    Past Medical History:  Diagnosis Date  . Anxiety   . Blood transfusion without reported diagnosis   . Neuromuscular disorder (HCC)      Home Meds: No outpatient prescriptions prior to visit.   No facility-administered medications prior to visit.     Allergies: Not on File  Social History   Social History  . Marital status: Married    Spouse name: N/A  . Number of children: N/A  . Years of education: N/A   Occupational History  . Not on file.   Social History Main Topics  . Smoking status: Former Games developer  . Smokeless tobacco: Never Used  . Alcohol use Not on file  . Drug use: No  . Sexual activity: Not on file   Other Topics Concern  . Not on file   Social History Narrative  . No narrative on file    Family History  Problem Relation Age of Onset  .  Alcohol abuse Father   . Diabetes Maternal Uncle   . Arthritis Maternal Grandmother   . Cancer Maternal Grandfather      Review of Systems:  See HPI for pertinent ROS. All other ROS negative.    Physical Exam: Blood pressure 118/80, pulse 74, temperature 98.3 F (36.8 C), temperature source Oral, resp. rate 16, height 5' 3.5" (1.613 m), weight 53.1 kg (117 lb), last menstrual period 10/14/2016, SpO2 99 %., Body mass index is 20.4 kg/m. General: WNWD WF. Appears in no acute distress. Neck: Supple. No thyromegaly. No lymphadenopathy. She does have some tenderness with palpation along each side of the neck and tops of  shoulders. Range of motion is slightly decreased bilaterally but is intact. Lungs: Clear bilaterally to auscultation without wheezes, rales, or rhonchi. Breathing is unlabored. Heart: RRR with S1 S2. No murmurs, rubs, or gallops. Abdomen: Soft, non-tender, non-distended with normoactive bowel sounds. No hepatomegaly. No rebound/guarding. No obvious abdominal masses. Musculoskeletal:  Strength and tone normal for age. Extremities/Skin: Warm and dry.  Neuro: Alert and oriented X 3. Moves all extremities spontaneously. Gait is normal. CNII-XII grossly in tact. Psych:  Responds to questions appropriately with a normal affect.     ASSESSMENT AND PLAN:  46 y.o. year old female with  1. Fibromyalgia This is stable/controlled on current medication. Will continue the Cymbalta at current dose. - DULoxetine (CYMBALTA) 60 MG capsule; Take 1 capsule (60 mg total) by mouth 2 (two) times daily.  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 Kandyce Rud or Dr. Marlana Latus a comment to the referral order regarding need to see one of these 2 physicians. - Ambulatory referral to Neurology  3. History of recurrent UTIs Stable/controlled. Continue daily Keflex. This has been evaluated by urology and she has had extensive testing done by urology. - cephALEXin (KEFLEX) 250 MG capsule; Take 1 capsule (250 mg total) by mouth daily.  Dispense: 90 capsule; Refill: 1  4. Breast cancer screening I have placed order for mammogram. Explained to her that she will get a phone call regarding this appointment. - MM DIGITAL SCREENING BILATERAL; Future  Routine follow-up visit in 6 months. Follow-up sooner if needed.  She states that she had full panel of labs/CPE around June or July 2018. Will discuss doing another CPE around August 2019.  Signed, [address] [place], [place], [facility] 11/07/2016 11:14 AM

## 2016-11-07 NOTE — Addendum Note (Signed)
Addended by: Phineas Semen A on: 11/07/2016 03:20 PM   Modules accepted: Orders

## 2016-11-21 ENCOUNTER — Telehealth (INDEPENDENT_AMBULATORY_CARE_PROVIDER_SITE_OTHER): Payer: Self-pay | Admitting: Internal Medicine

## 2016-11-21 NOTE — Telephone Encounter (Signed)
11/21/2016 Received a call from [facility] for Sleep Study results from 2016. Pt has relocated to [facility]. Spoke to Ms.Shenna.

## 2016-11-23 ENCOUNTER — Encounter: Payer: Self-pay | Admitting: *Deleted

## 2016-11-23 ENCOUNTER — Ambulatory Visit
Admission: RE | Admit: 2016-11-23 | Discharge: 2016-11-23 | Disposition: A | Discharge status: Home / Self Care | Payer: PRIVATE HEALTH INSURANCE | Source: Ambulatory Visit | Attending: Physician Assistant | Admitting: Physician Assistant

## 2016-11-23 DIAGNOSIS — Z1239 Encounter for other screening for malignant neoplasm of breast: Secondary | ICD-10-CM

## 2016-11-23 LAB — HM MAMMOGRAPHY

## 2016-12-22 ENCOUNTER — Encounter (INDEPENDENT_AMBULATORY_CARE_PROVIDER_SITE_OTHER): Payer: Self-pay

## 2016-12-22 ENCOUNTER — Encounter: Payer: Self-pay | Admitting: Neurology

## 2016-12-22 ENCOUNTER — Ambulatory Visit (INDEPENDENT_AMBULATORY_CARE_PROVIDER_SITE_OTHER): Payer: TRICARE For Life (TFL) | Admitting: Neurology

## 2016-12-22 VITALS — BP 125/81 | HR 68 | Ht 63.0 in | Wt 121.0 lb

## 2016-12-22 DIAGNOSIS — G47419 Narcolepsy without cataplexy: Secondary | ICD-10-CM

## 2016-12-22 MED ORDER — SODIUM OXYBATE 500 MG/ML PO SOLN: 4.5000 mg | Freq: Two times a day (BID) | ORAL | 5 refills | Status: DC

## 2016-12-22 NOTE — Progress Notes (Addendum)
SLEEP MEDICINE CLINIC   Provider:  Melvyn Novasarmen  Louann Hopson, M D  Primary Care Physician:  Deon Pillingixon, Mary B, PA-C   Referring Provider: Dorena Bodoixon, Mary B, PA-C    Chief Complaint  Patient presents with  . New Patient (Initial Visit)    2 sleep studied done, most recent in 2016. pt has been on xyrem since 2008/09. presents today to establish care to continue treatment for her narcolepsy.     HPI:  Heidi Gross is a 46 y.o. female , seen here  [facility], the patient carries a diagnosis of narcolepsy since age 46.  She was officially diagnosed by proper testing about 18 years ago. Chief complaint according to patient : I need XYREM refilled.   She was [place] [facility] Dr. Elijah Birkom [place]. She did undergo a diagnostic polysomnogram last on 31 October 2014- sleep study documented a total sleep time of 390 out of a recording time the test was performed while the patient was on Xyrem and did therefore not to document any REM sleep. There was no significant apnea noted;  her AHI was 1.2/h, there was no oxygen desaturation, nadir of oxygen was 92% SPO2.  She had frequent periodic limb movements of sleep but these were also only relating to arousals at 3.3/h, they did not repeat an M SLT, but she had a positive M SLT several years earlier.  The conclusion of her treating physician was that she continues to be treated with Xyrem, and that she may try a low dose dopamine agonist for the treatment of PLM's.  Before diagnosis, she went ot bed any night between 9-10 PM and would wake only at noon the following day, she cold not resist the urge to fall asleep, it was dangerous for her to drive and she avoided driving because of fear of falling asleep at the wheel.  She used to drive with the radio on loud volume, the window open, needing  to eat or drink something to keep her from sleeping.    Sleep habits are as follows:The patient  [place], following her husband's retirement from the [employer]. She is here to continue her narcolepsy care.  She has been adherent with the Xyrem intake guidelines, in bed when she takes a dose.  Not experience parasomnias.  She usually takes the medication at about 9:30 PM will be asleep by 10 PM, wakes up about 1 AM to take her second dose.  She will sleep through until the morning hour, rises by 6 AM.  She gets a good 6-8 hours of nocturnal sleep.  She does not experience vivid dreams now, neither is  sleep paralysis reported,nor dream intrusion. She also denies any cataplexy to be present at this time.   Sleep medical history and family sleep history:  She failed ritalin, Modafinil and Armodafinil. Only XYREM let to success.    Social history: married to a now retired Nurse, adultnaval member, 3 daughters age  46, 3116, 2913.  The patient was not on Xyrem at the time of these pregnancies.  Review of Systems: Out of a complete 14 system review, the patient complains of only the following symptoms, and all other reviewed systems are negative.  "XYREM changed my life"   Epworth score 3 , Fatigue severity score 13  , depression score 1/15    Social History  Socioeconomic History  . Marital status: Married    Spouse name: Not on file  . Number of children: Not on file  . Years of education: Not on file  . Highest education level: Not on file  Social Needs  . Financial resource strain: Not on file  . Food insecurity - worry: Not on file  . Food insecurity - inability: Not on file  . Transportation needs - medical: Not on file  . Transportation needs - non-medical: Not on file  Occupational History  . Not on file  Tobacco Use  . Smoking status: Former Games developermoker  . Smokeless tobacco: Never Used  Substance and Sexual Activity  . Alcohol use: Not on file  . Drug use: No  . Sexual activity: Not on file  Other Topics Concern  . Not on file    Social History Narrative  . Not on file    Family History  Problem Relation Age of Onset  . Alcohol abuse Father   . Diabetes Maternal Uncle   . Arthritis Maternal Grandmother   . Cancer Maternal Grandfather     Past Medical History:  Diagnosis Date  . Anxiety   . Blood transfusion without reported diagnosis   . Neuromuscular disorder [facility](HCC)     Past Surgical History:  Procedure Laterality Date  . AUGMENTATION MAMMAPLASTY    . CESAREAN SECTION      Current Outpatient Medications  Medication Sig Dispense Refill  . cephALEXin (KEFLEX) 250 MG capsule Take 1 capsule (250 mg total) by mouth daily. 90 capsule 1  . DULoxetine (CYMBALTA) 60 MG capsule Take 1 capsule (60 mg total) by mouth 2 (two) times daily. 180 capsule 1  . Sodium Oxybate (XYREM) 500 MG/ML SOLN Take 4.5 mg by mouth 2 (two) times daily. Take 4.5mg  po diluted in 60ml of water at bedtime,repeat 2 1/2 to 4 hours later     No current facility-administered medications for this visit.     Allergies as of 12/22/2016 - Review Complete 12/22/2016  Allergen Reaction Noted  . Sulfa antibiotics  12/22/2016    Vitals: BP 125/81   Pulse 68   Ht 5\' 3"  (1.6 m)   Wt 121 lb (54.9 kg)   BMI 21.43 kg/m  Last Weight:  Wt Readings from Last 1 Encounters:  12/22/16 121 lb (54.9 kg)   ZOX:WRUEBMI:Body mass index is 21.43 kg/m.     Last Height:   Ht Readings from Last 1 Encounters:  12/22/16 5\' 3"  (1.6 m)    Physical exam:  General: The patient is awake, alert and appears not in acute distress. The patient is well groomed. Head: Normocephalic, atraumatic. Neck is supple. Mallampati 2  neck circumference: 13 . Nasal airflow patent , TMJ is  Not  evident . Retrognathia is not seen.  Cardiovascular:  Regular rate and rhythm , without  murmurs or carotid bruit, and without distended neck veins. Respiratory: Lungs are clear to auscultation. Skin:  Without evidence of edema, or rash Trunk: BMI is 21.5.  The patient's posture is  erect  Neurologic exam : The patient is awake and alert, oriented to place and time.  Attention span & concentration ability appears normal.  Speech is fluent,  without dysarthria, dysphonia or aphasia.  Mood and affect are appropriate.  Cranial nerves: Pupils are equal and briskly reactive to light. Funduscopic exam without  evidence of pallor or edema. Extraocular movements  in vertical and horizontal planes intact and without nystagmus. Visual fields by finger perimetry are  intact.Hearing to finger rub intact.  Facial sensation intact to fine touch. Facial motor strength is symmetric and tongue and uvula move midline. Shoulder shrug was symmetrical.  Motor exam:   Normal tone, muscle bulk and symmetric strength in all extremities. Sensory:  Fine touch, pinprick and vibration were tested in all extremities. Proprioception tested in the upper extremities was normal. Coordination: Finger-to-nose maneuver  normal without evidence of ataxia, dysmetria or tremor. Gait and station: Patient walks without assistive device Deep tendon reflexes: in the  upper and lower extremities are symmetric and intact.   Assessment:  After physical and neurologic examination, review of laboratory studies,  Personal review of imaging studies, reports of other /same  Imaging studies, results of polysomnography and / or neurophysiology testing and pre-existing records as far as provided in visit., my assessment is   1) Heidi Gross was diagnosed by a PSG followed by an M SLT in [place], but I do not have access to the original records.  However I do not doubt that she was put on Xyrem because her test was positive for narcolepsy.  Her most recent sleep study, which is available to me today, was performed in [place], [place]. It was a PSG not followed by an M SLT and performed while the patient was on Xyrem.  It took place on 30 October 2016.  Heidi Gross has reported that Xyrem changed her life-  that she  failed several other medications before Xyrem was initiated.  She is on an appropriate dose for an adult patient of 4.5 g twice nightly and should continue on this medication.  The patient was advised of the nature of the diagnosed disorder , the treatment options and the  risks for general health and wellness arising from not treating the condition.  I spent more than 45 minutes of face to face time with the patient.  Greater than 50% of time was spent in counseling and coordination of care. We have discussed the diagnosis and differential and I answered the patient's questions.    Plan:  Treatment plan and additional workup :  XYREM 4.5 gram w twice nightly As discussed, Xyrem has to be taken with very mindful caution: Taking Xyrem correctly is key. This means, take it only when you are fully ready to fall asleep, while in bed and refrain from doing any other activities, even brushing  your teeth after taking your first dose. The second dose will be about 2-1/2-4 hours after his first dose. You can go to the bathroom before your 2nd dose. Take your first dose, when actually IN BED, ready to sleep. No sitting up in bed, NO reading, NO using the cell phone or computer, NO getting up to use the bathroom. Take care of everything BEFORE sleep time. Try NOT to skip the second dose as the Xyrem is not going to stay in your system long enough with only one dose. Do not drink alcohol with Xyrem. If you do drink Alcohol, you cannot take your Xyrem doses that night.   Rv in 6 month, every 6 month= with labs. CMET today .    Melvyn Novas, MD 12/22/2016, 11:57 AM  Certified in Neurology by ABPN Certified in Sleep Medicine by [facility] [address], [address] [place], [place] [address]

## 2016-12-22 NOTE — Patient Instructions (Signed)
As discussed, Xyrem has to be taken with very mindful caution: Taking Xyrem correctly is key. This means, take it only when you are fully ready to fall asleep, while in bed and refrain from doing any other activities, even brushing  your teeth after taking your first dose. The second dose will be about 2-1/2-4 hours after his first dose. You can go to the bathroom before your 2nd dose. Take your first dose, when actually IN BED, ready to sleep. No sitting up in bed, NO reading, NO using the cell phone or computer, NO getting up to use the bathroom. Take care of everything BEFORE sleep time. Try NOT to skip the second dose as the Xyrem is not going to stay in your system long enough with only one dose. Do not drink alcohol with Xyrem. If you do drink Alcohol, you cannot take your Xyrem doses that night.   

## 2016-12-23 LAB — COMPREHENSIVE METABOLIC PANEL
A/G RATIO: 2 (ref 1.2–2.2)
ALBUMIN: 4.5 g/dL (ref 3.5–5.5)
ALK PHOS: 28 IU/L — AB (ref 39–117)
ALT: 27 IU/L (ref 0–32)
AST: 63 IU/L — ABNORMAL HIGH (ref 0–40)
BUN / CREAT RATIO: 15 (ref 9–23)
BUN: 12 mg/dL (ref 6–24)
Bilirubin Total: 0.5 mg/dL (ref 0.0–1.2)
CO2: 24 mmol/L (ref 20–29)
CREATININE: 0.78 mg/dL (ref 0.57–1.00)
Calcium: 9.8 mg/dL (ref 8.7–10.2)
Chloride: 100 mmol/L (ref 96–106)
GFR calc Af Amer: 105 mL/min/{1.73_m2} (ref >59)
GFR calc non Af Amer: 91 mL/min/{1.73_m2} (ref >59)
GLOBULIN, TOTAL: 2.3 g/dL (ref 1.5–4.5)
Glucose: 89 mg/dL (ref 65–99)
POTASSIUM: 4.5 mmol/L (ref 3.5–5.2)
SODIUM: 139 mmol/L (ref 134–144)
Total Protein: 6.8 g/dL (ref 6.0–8.5)

## 2016-12-26 ENCOUNTER — Telehealth: Payer: Self-pay | Admitting: Neurology

## 2016-12-26 NOTE — Telephone Encounter (Signed)
Called and discussed the lab work with the patient. I have made her aware of the elevated liver enzyme and I have instructed the patient to follow these labs up with her PCP in 3 months per Dr Dohmeier's recommendation. Pt verbalized understanding. Pt had no questions at this time but was encouraged to call back if questions arise.

## 2016-12-26 NOTE — Telephone Encounter (Signed)
-----   Message from Melvyn Novasarmen Dohmeier, MD sent at 12/23/2016 10:30 AM EST ----- Elevated AST noted, this is a liver enzyme and needs to be followed  q 3 month. At this time we continue XYREM and recommend to see PCP for a GI work up- it can be many things, from gallstones to Tylenol, causing this rise.

## 2016-12-29 ENCOUNTER — Other Ambulatory Visit: Payer: Self-pay

## 2016-12-29 ENCOUNTER — Encounter: Payer: Self-pay | Admitting: Physician Assistant

## 2016-12-29 ENCOUNTER — Ambulatory Visit (INDEPENDENT_AMBULATORY_CARE_PROVIDER_SITE_OTHER): Payer: TRICARE For Life (TFL) | Admitting: Physician Assistant

## 2016-12-29 VITALS — BP 118/78 | HR 76 | Temp 98.3°F | Resp 14 | Wt 121.8 lb

## 2016-12-29 DIAGNOSIS — G8929 Other chronic pain: Secondary | ICD-10-CM | POA: Insufficient documentation

## 2016-12-29 DIAGNOSIS — M542 Cervicalgia: Secondary | ICD-10-CM | POA: Diagnosis not present

## 2016-12-29 MED ORDER — CYCLOBENZAPRINE HCL 10 MG PO TABS: 10.0000 mg | ORAL_TABLET | Freq: Three times a day (TID) | ORAL | 5 refills | Status: DC | PRN

## 2016-12-29 MED ORDER — CYCLOBENZAPRINE HCL 10 MG PO TABS: 10.0000 mg | ORAL_TABLET | Freq: Three times a day (TID) | ORAL | 0 refills | Status: DC | PRN

## 2016-12-29 NOTE — Progress Notes (Signed)
Patient ID: Heidi Gross MRN: [id number], DOB: [birth year], 46 y.o. Date of Encounter: 12/29/2016, 2:21 PM    Chief Complaint:  Chief Complaint  Patient presents with  . Headache    with neck pain      HPI: 46 y.o. year old female presents with above.   I reviewed her prior OV note with me.  She has a history of fibromyalgia and is on Cymbalta for this.  Also at that OV she did mention having some neck pain.  She states that since then she has been applying heat to her neck like we had discussed.  Says that she is having pain in the back of her neck on both sides--near base of skull--- says that she feels it on both sides but the right is a little worse than the left.  Says it is to the point that she has an almost constant headache there.  Says that, at times, she will turn her neck a certain way and will feel stabbing pain shoot up the back of her head.  States that she tries to avoid taking any medicines unless she absolutely has to and therefore very rarely uses ibuprofen.  She has had no pain no numbness no tingling down either arm or hand.     Home Meds:   Outpatient Medications Prior to Visit  Medication Sig Dispense Refill  . cephALEXin (KEFLEX) 250 MG capsule Take 1 capsule (250 mg total) by mouth daily. 90 capsule 1  . DULoxetine (CYMBALTA) 60 MG capsule Take 1 capsule (60 mg total) by mouth 2 (two) times daily. 180 capsule 1  . Sodium Oxybate (XYREM) 500 MG/ML SOLN Take 0.01 mLs (5 mg total) 2 (two) times daily by mouth. Take 4.5mg  po diluted in 60ml of water at bedtime,repeat 2 1/2 to 4 hours later 270 mL 5   No facility-administered medications prior to visit.     Allergies:  Allergies  Allergen Reactions  . Sulfa Antibiotics       Review of Systems: See HPI for pertinent ROS. All other ROS negative.    Physical Exam: Blood pressure 118/78, pulse 76, temperature 98.3 F (36.8 C), temperature source Oral, resp. rate 14, weight 55.2 kg (121 lb 12.8 oz), last  menstrual period 12/05/2016, SpO2 99 %., Body mass index is 21.58 kg/m. General:  WNWD WF. Appears in no acute distress. Neck: Supple. No thyromegaly. No lymphadenopathy. She has a focal area of tenderness just inferior to base of the skull on the right and also on the left.  She has no tenderness with palpation along the side of the neck--at area of the trapezius.  She has full range of motion of the neck.  5/5 bilateral arm strength, 5/5 bilateral grip strength. Lungs: Clear bilaterally to auscultation without wheezes, rales, or rhonchi. Breathing is unlabored. Heart: Regular rhythm. No murmurs, rubs, or gallops. Msk:  Strength and tone normal for age. Extremities/Skin: Warm and dry.  Neuro: Alert and oriented X 3. Moves all extremities spontaneously. Gait is normal. CNII-XII grossly in tact. Psych:  Responds to questions appropriately with a normal affect.     ASSESSMENT AND PLAN:  46 y.o. year old female with  1. Chronic neck pain Discussed with her that suspect this is part of her fibromyalgia syndrome, she is feeling pain where these muscles insert to bone. Discussed that the sharper, shooting pain is secondary to nerve.     Obtain x-ray.  Continue the Cymbalta.  Can you heat and range  of motion/stretching throughout the day.  Add muscle relaxer.  Cautioned that this can cause drowsiness and also she will not take this at the same time as her Xyrem.  - cyclobenzaprine (FLEXERIL) 10 MG tablet; Take 1 tablet (10 mg total) 3 (three) times daily as needed by mouth for muscle spasms.  Dispense: 30 tablet; Refill: 0 - [facility] Cervical Spine Complete; Future   Signed, Shon HaleMary Beth Gross, [place], [facility] 12/29/2016 2:21 PM

## 2016-12-30 ENCOUNTER — Other Ambulatory Visit: Payer: Self-pay

## 2016-12-30 ENCOUNTER — Other Ambulatory Visit (INDEPENDENT_AMBULATORY_CARE_PROVIDER_SITE_OTHER): Payer: Self-pay | Admitting: Physician Assistant

## 2016-12-30 DIAGNOSIS — G4711 Idiopathic hypersomnia with long sleep time: Secondary | ICD-10-CM

## 2016-12-30 DIAGNOSIS — G8929 Other chronic pain: Secondary | ICD-10-CM

## 2016-12-30 DIAGNOSIS — M542 Cervicalgia: Principal | ICD-10-CM

## 2016-12-30 MED ORDER — SODIUM OXYBATE 500 MG/ML OR SOLN
ORAL | 3 refills | Status: AC
Start: 2016-12-30 — End: ?

## 2017-01-02 ENCOUNTER — Telehealth: Payer: Self-pay

## 2017-01-02 NOTE — Telephone Encounter (Signed)
Remove the Flexeril from med list. Inform patient that cannot use any type of muscle relaxer--- with other medication. Inform her to continue to apply heat if needed, stretch her neck as discussed.  Massage the area, can use over-the-counter medications such as Tylenol or anti-inflammatories with food as needed.

## 2017-01-02 NOTE — Telephone Encounter (Signed)
FAXED received from [facility] indicating a drug interaction. Patient is taking cyclobenzaprine 10 mg and XYRE.  Additive CNS depressant effects may be possible when sodium oxybate is used  Concurrently with skeletal relaxants Pls advise if pt should continue with medication

## 2017-01-03 NOTE — Telephone Encounter (Signed)
Spoke with Rod with [facility] as well as the patient and notified them that cyclobenzaprine will be canceled. Patient is aware of provider recommendations as far as what she can take otc

## 2017-04-24 ENCOUNTER — Telehealth: Payer: Self-pay | Admitting: Neurology

## 2017-04-24 NOTE — Telephone Encounter (Signed)
PA completed through cover my meds and approved immediately. From now until 04/24/2018.

## 2017-05-06 ENCOUNTER — Other Ambulatory Visit: Payer: Self-pay | Admitting: Physician Assistant

## 2017-05-06 DIAGNOSIS — M797 Fibromyalgia: Secondary | ICD-10-CM

## 2017-05-08 NOTE — Telephone Encounter (Signed)
Refill appropriate 

## 2017-06-07 ENCOUNTER — Other Ambulatory Visit: Payer: Self-pay | Admitting: Neurology

## 2017-06-07 MED ORDER — SODIUM OXYBATE 500 MG/ML PO SOLN: ORAL | 5 refills | Status: DC

## 2017-06-07 NOTE — Telephone Encounter (Signed)
Pt is due for a refill on xyrem, last written in November. Will fill out the xyrem [facility] refill form and give to Dr. Frances FurbishAthar to review, on Dr. Oliva Bustardohmeier's behalf. Per Dr. Oliva Bustardohmeier's note, pt is taking xyrem 4.5 g PO twice nightly.

## 2017-06-07 NOTE — Addendum Note (Signed)
Addended by: Geronimo RunningINKINS, Misk Galentine A on: 06/07/2017 11:51 AM   Modules accepted: Orders

## 2017-06-07 NOTE — Telephone Encounter (Signed)
RX for xyrem faxed to xyrem pharmacy.

## 2017-06-07 NOTE — Telephone Encounter (Signed)
Melanie with [facility] pharmacy called in stating that patient needs a refill on Xyrem. Her phone number is [phone].

## 2017-06-22 ENCOUNTER — Ambulatory Visit (INDEPENDENT_AMBULATORY_CARE_PROVIDER_SITE_OTHER): Admitting: Neurology

## 2017-06-22 ENCOUNTER — Encounter: Payer: Self-pay | Admitting: Neurology

## 2017-06-22 VITALS — BP 107/70 | HR 70 | Ht 63.0 in | Wt 117.5 lb

## 2017-06-22 DIAGNOSIS — G47419 Narcolepsy without cataplexy: Secondary | ICD-10-CM | POA: Diagnosis not present

## 2017-06-22 DIAGNOSIS — G4719 Other hypersomnia: Secondary | ICD-10-CM

## 2017-06-22 DIAGNOSIS — Z5181 Encounter for therapeutic drug level monitoring: Secondary | ICD-10-CM | POA: Diagnosis not present

## 2017-06-22 MED ORDER — SODIUM OXYBATE 500 MG/ML PO SOLN: ORAL | 5 refills | Status: DC

## 2017-06-22 NOTE — Progress Notes (Signed)
SLEEP MEDICINE CLINIC   Provider:  Larey Seat, M D  Primary Care Physician:  Rennis Golden   Referring Provider: Orlena Sheldon, PA-C    Chief Complaint  Patient presents with  . New Patient (Initial Visit)    2 sleep studied done, most recent in 2016. pt has been on xyrem since 2008/09. presents today to establish care to continue treatment for her narcolepsy.     HPI:  Heidi Gross is a 47 y.o. female , seen here  in a referral from [place] for transfer of care, the patient carries a diagnosis of narcolepsy since age 53.  She was officially diagnosed by proper testing about 18 years ago. Chief complaint according to patient : I need XYREM refilled.  She was followed by the [place], [place], based [facility] of Dr. Gershon Mussel [place]. She did undergo a diagnostic polysomnogram last on 31 October 2014- sleep study documented a total sleep time of 390 out of a recording time the test was performed while the patient was on Xyrem and did therefore not to document any REM sleep. There was no significant apnea noted;  her AHI was 1.2/h, there was no oxygen desaturation, nadir of oxygen was 92% SPO2.  She had frequent periodic limb movements of sleep but these were also only relating to arousals at 3.3/h, they did not repeat an M SLT, but she had a positive M SLT several years earlier.  The conclusion of her treating physician was that she continues to be treated with Xyrem, and that she may try a low dose dopamine agonist for the treatment of PLM's.  Before diagnosis, she went ot bed any night between 9-10 PM and would wake only at noon the following day, she cold not resist the urge to fall asleep, it was dangerous for her to drive and she avoided driving because of fear of falling asleep at the wheel.  She used to drive with the radio on loud volume, the window open, needing  to eat or drink something to keep her from sleeping.    Sleep habits are as follows:The patient  lived right outside [place] until she moved to [facility], on September 19, 2016, following her husband's retirement from the [employer]. She is here to continue her narcolepsy care.  She has been adherent with the Xyrem intake guidelines, in bed when she takes a dose.  Not experience parasomnias.  She usually takes the medication at about 9:30 PM will be asleep by 10 PM, wakes up about 1 AM to take her second dose.  She will sleep through until the morning hour, rises by 6 AM.  She gets a good 6-8 hours of nocturnal sleep.  She does not experience vivid dreams now, neither is  sleep paralysis reported,nor dream intrusion. She also denies any cataplexy to be present at this time. Sleep medical history and family sleep history:  She failed ritalin, Modafinil and Armodafinil. Only XYREM let to success.  Social history: married to a now retired Musician, 3 daughters age  44, 45, 27.  The patient was not on Xyrem at the time of these pregnancies.  Heidi Gross is a 47 y.o. female , who is seen here as in revisit after  Dr. Doren [facility] for transfer of care for narcolepsy. Mr Axelson is a retired from [employer] and now at [employer] .   We are meeting today after recent testing.  Mrs. Maines has been prescribed to to restart  on Xyrem-sodium oxybate and she is now taking 4.5 g at bedtime.  She takes a second dose about 2-1/2 hours later.  Xyrem has made a huge I difference in her life .     Review of Systems: Out of a complete 14 system review, the patient complains of only the following symptoms, and all other reviewed systems are negative.  "XYREM changed my life"  Adderall and ritalin failed to treat symptoms.   Epworth score 2/ 24 on XYREM, Fatigue severity score 13  , depression score 1/15    Social History   Socioeconomic History  . Marital status: Married    Spouse name: Not on file  . Number of children: Not on file  . Years of education: Not on file  . Highest education level: Not on file    Social Needs  . Financial resource strain: Not on file  . Food insecurity - worry: Not on file  . Food insecurity - inability: Not on file  . Transportation needs - medical: Not on file  . Transportation needs - non-medical: Not on file  Occupational History  . Not on file  Tobacco Use  . Smoking status: Former Research scientist (life sciences)  . Smokeless tobacco: Never Used  Substance and Sexual Activity  . Alcohol use: Not on file  . Drug use: No  . Sexual activity: Not on file  Other Topics Concern  . Not on file  Social History Narrative  . Not on file    Family History  Problem Relation Age of Onset  . Alcohol abuse Father   . Diabetes Maternal Uncle   . Arthritis Maternal Grandmother   . Cancer Maternal Grandfather     Past Medical History:  Diagnosis Date  . Anxiety   . Blood transfusion without reported diagnosis   . Neuromuscular disorder [facility])     Past Surgical History:  Procedure Laterality Date  . AUGMENTATION MAMMAPLASTY    . CESAREAN SECTION      Current Outpatient Medications  Medication Sig Dispense Refill  . cephALEXin (KEFLEX) 250 MG capsule Take 1 capsule (250 mg total) by mouth daily. 90 capsule 1  . DULoxetine (CYMBALTA) 60 MG capsule Take 1 capsule (60 mg total) by mouth 2 (two) times daily. 180 capsule 1  . Sodium Oxybate (XYREM) 500 MG/ML SOLN Take 4.5 mg by mouth 2 (two) times daily. Take 4.32m po diluted in 671mof water at bedtime,repeat 2 1/2 to 4 hours later     No current facility-administered medications for this visit.     Allergies as of 12/22/2016 - Review Complete 12/22/2016  Allergen Reaction Noted  . Sulfa antibiotics  12/22/2016    Vitals: BP 125/81   Pulse 68   Ht '5\' 3"'  (1.6 m)   Wt 121 lb (54.9 kg)   BMI 21.43 kg/m  Last Weight:  Wt Readings from Last 1 Encounters:  12/22/16 121 lb (54.9 kg)   BMQMV:HQIOass index is 21.43 kg/m.     Last Height:   Ht Readings from Last 1 Encounters:  12/22/16 '5\' 3"'  (1.6 m)    Physical  exam:  General: The patient is awake, alert and appears not in acute distress. The patient is well groomed. Head: Normocephalic, atraumatic. Neck is supple. Mallampati 2  neck circumference: 13 . Nasal airflow patent , TMJ is  Not  evident . Retrognathia is not seen.  Cardiovascular:  Regular rate and rhythm , without  murmurs or carotid bruit, and without distended neck veins. Respiratory:  Lungs are clear to auscultation. Skin:  Without evidence of edema, or rash Trunk: BMI is 21.5.  The patient's posture is erect  Neurologic exam : The patient is awake and alert, oriented to place and time.  Attention span & concentration ability appears normal.  Speech is fluent,  without dysarthria, dysphonia or aphasia.  Mood and affect are appropriate.  Cranial nerves: Pupils are equal and briskly reactive to light. Funduscopic exam without  evidence of pallor or edema. Extraocular movements  in vertical and horizontal planes intact and without nystagmus. Visual fields by finger perimetry are intact.Hearing to finger rub intact.  Facial sensation intact to fine touch. Facial motor strength is symmetric and tongue and uvula move midline. Shoulder shrug was symmetrical.  Motor exam:   Normal tone, muscle bulk and symmetric strength in all extremities. Sensory:  Fine touch, pinprick and vibration were tested in all extremities. Proprioception tested in the upper extremities was normal. Coordination: Finger-to-nose maneuver  normal without evidence of ataxia, dysmetria or tremor. Gait and station: Patient walks without assistive device Deep tendon reflexes: in the  upper and lower extremities are symmetric and intact.   Assessment:  After physical and neurologic examination, review of laboratory studies,  Personal review of imaging studies, reports of other /same  Imaging studies, results of polysomnography and / or neurophysiology testing and pre-existing records as far as provided in visit., my assessment  is   1) Mrs. Schechter was diagnosed by a PSG followed by an M SLT in [place], but I do not have access to the original records.   However I do not doubt that she was put on Xyrem because her test was positive for narcolepsy.  Her most recent sleep study, which is available to me today, was performed in [place], [place]. It was a PSG not followed by an M SLT and performed while the patient was on Xyrem.  It took place on 30 October 2016.  Her mother has narcolepsy.  Mrs. Convery has reported that Xyrem changed her life-  that she failed several other medications before Xyrem was initiated.  She is on an appropriate dose for an adult patient of 4.5 g twice nightly and should continue on this medication.  She will have a repeat LFT  today.   I spent more than 25 minutes of face to face time with the patient.  Greater than 50% of time was spent in counseling and coordination of care. We have discussed the diagnosis and differential and I answered the patient's questions.    Plan:  Treatment plan and additional workup : refilled XYREM.  4.5 gram-  HLA narcolepsy testing.     Larey Seat, MD 10/17/8180, 99:37 AM  Certified in Neurology by ABPN Certified in [place] by [facility] [address], [place], [place] [address]

## 2017-06-22 NOTE — Patient Instructions (Signed)
As discussed, Xyrem has to be taken with very mindful caution: Taking Xyrem correctly is key. This means, take it only when you are fully ready to fall asleep, while in bed and refrain from doing any other activities, even brushing  your teeth after taking your first dose. The second dose will be about 2-1/2-4 hours after his first dose. You can go to the bathroom before your 2nd dose. Take your first dose, when actually IN BED, ready to sleep. No sitting up in bed, NO reading, NO using the cell phone or computer, NO getting up to use the bathroom. Take care of everything BEFORE sleep time. Try NOT to skip the second dose as the Xyrem is not going to stay in your system long enough with only one dose. Do not drink alcohol with Xyrem. If you do drink Alcohol, you cannot take your Xyrem doses that night.   

## 2017-06-27 LAB — COMPREHENSIVE METABOLIC PANEL
ALT: 18 IU/L (ref 0–32)
AST: 25 IU/L (ref 0–40)
Albumin/Globulin Ratio: 2.1 (ref 1.2–2.2)
Albumin: 4.7 g/dL (ref 3.5–5.5)
Alkaline Phosphatase: 34 IU/L — ABNORMAL LOW (ref 39–117)
BUN/Creatinine Ratio: 17 (ref 9–23)
BUN: 15 mg/dL (ref 6–24)
Bilirubin Total: 0.5 mg/dL (ref 0.0–1.2)
CALCIUM: 10.1 mg/dL (ref 8.7–10.2)
CO2: 24 mmol/L (ref 20–29)
CREATININE: 0.86 mg/dL (ref 0.57–1.00)
Chloride: 101 mmol/L (ref 96–106)
GFR, EST AFRICAN AMERICAN: 93 mL/min/{1.73_m2} (ref >59)
GFR, EST NON AFRICAN AMERICAN: 81 mL/min/{1.73_m2} (ref >59)
GLUCOSE: 89 mg/dL (ref 65–99)
Globulin, Total: 2.2 g/dL (ref 1.5–4.5)
Potassium: 4.6 mmol/L (ref 3.5–5.2)
Sodium: 138 mmol/L (ref 134–144)
TOTAL PROTEIN: 6.9 g/dL (ref 6.0–8.5)

## 2017-06-27 LAB — NARCOLEPSY EVALUATION
HLA-DQ ALPHA: NEGATIVE
HLA-DQ BETA: NEGATIVE

## 2017-06-28 ENCOUNTER — Telehealth: Payer: Self-pay | Admitting: Neurology

## 2017-06-28 NOTE — Telephone Encounter (Signed)
Called and informed the patient that lab work all looked well and her HLA was negative for the narcolepsy gene. Pt verbalized understanding. Pt had no questions at this time but was encouraged to call back if questions arise.

## 2017-06-28 NOTE — Telephone Encounter (Signed)
-----  Message from Larey Seat, MD sent at 06/23/2017 11:34 AM EDT ----- All good to go- alk phosphatase is the only "abnormality" and is less abnormal than 6 month ago. Normalized transaminases of the liver. No change in meds needed. Narcolepsy panel is still pending.

## 2017-08-24 ENCOUNTER — Other Ambulatory Visit: Payer: Self-pay | Admitting: Physician Assistant

## 2017-08-24 DIAGNOSIS — Z87440 Personal history of urinary (tract) infections: Secondary | ICD-10-CM

## 2017-08-24 NOTE — Telephone Encounter (Signed)
Approved. # 90 + 2. 

## 2017-08-24 NOTE — Telephone Encounter (Signed)
Last OV 11/07/2016 Last refill 11/07/2016 Ok to refill?

## 2017-11-04 ENCOUNTER — Other Ambulatory Visit: Payer: Self-pay | Admitting: Physician Assistant

## 2017-11-04 DIAGNOSIS — M797 Fibromyalgia: Secondary | ICD-10-CM

## 2017-12-26 ENCOUNTER — Encounter: Payer: Self-pay | Admitting: Neurology

## 2017-12-26 ENCOUNTER — Telehealth: Payer: Self-pay | Admitting: *Deleted

## 2017-12-26 ENCOUNTER — Ambulatory Visit (INDEPENDENT_AMBULATORY_CARE_PROVIDER_SITE_OTHER): Admitting: Neurology

## 2017-12-26 VITALS — BP 113/77 | HR 74 | Ht 63.0 in | Wt 126.0 lb

## 2017-12-26 DIAGNOSIS — Z820 Family history of epilepsy and other diseases of the nervous system: Secondary | ICD-10-CM | POA: Insufficient documentation

## 2017-12-26 DIAGNOSIS — G4719 Other hypersomnia: Secondary | ICD-10-CM

## 2017-12-26 DIAGNOSIS — G47419 Narcolepsy without cataplexy: Secondary | ICD-10-CM | POA: Diagnosis not present

## 2017-12-26 DIAGNOSIS — Z5181 Encounter for therapeutic drug level monitoring: Secondary | ICD-10-CM | POA: Diagnosis not present

## 2017-12-26 MED ORDER — SODIUM OXYBATE 500 MG/ML PO SOLN: ORAL | 5 refills | Status: DC

## 2017-12-26 NOTE — Telephone Encounter (Signed)
[facility] requesting office notes sleep study mslt on 12/26/17.

## 2017-12-26 NOTE — Progress Notes (Signed)
SLEEP MEDICINE CLINIC   Provider:  Larey Seat, MD   Primary Care Physician:  Rennis Golden  Referring Provider: Orlena Sheldon, PA-C   Chief Complaint  Patient presents with  . New Patient (Initial Visit)    2 sleep studied done, most recent in 2016. pt has been on xyrem since 2008/09. presents today to establish care to continue treatment for her narcolepsy.     HPI:  Heidi Gross is a 47 y.o. female was seen here in a referral from [place] for transfer of care, the patient carries a diagnosis of narcolepsy since age 59.   She was officially diagnosed by proper testing about 18 years ago. Chief complaint according to patient : I need XYREM refilled.  She was followed by the [place], [place], based [facility] Group of Dr. Gershon Mussel [place]. She did undergo a diagnostic polysomnogram last on 31 October 2014- sleep study documented a total sleep time of 390 min.- the test was performed while the patient was on Xyrem and did therefore not to document any REM sleep.  There was no significant apnea noted;  her AHI was 1.2/h, there was no oxygen desaturation, nadir of oxygen was 92% SPO2.  She had frequent periodic limb movements of sleep but these were also only relating to arousals at 3.3/h, they did not repeat an M SLT, but she had a positive M SLT several years earlier ( ? Copies needed ) .   The conclusion of her treating physician was that she continues to be treated with Xyrem, and that she may try a low dose dopamine agonist for the treatment of PLM's.  Before diagnosis, she went ot bed any night between 9-10 PM and would wake only at noon the following day, she cold not resist the urge to fall asleep, it was dangerous for her to drive and she avoided driving because of fear of falling asleep at the wheel.  She used to drive with the radio on loud volume, the window open, needing  to eat or drink something to keep her from sleeping.    Sleep habits are as follows:The  patient lived right outside [place] until she moved to [facility], on September 19, 2016, following her husband's retirement from the [place] [employer]. She is here to continue her narcolepsy care.  She has been adherent with the Xyrem intake guidelines, in bed when she takes a dose.  Not experience parasomnias.  She usually takes the medication at about 9:30 PM will be asleep by 10 PM, wakes up about 1 AM to take her second dose.  She will sleep through until the morning hour, rises by 6 AM.  She gets a good 6-8 hours of nocturnal sleep.  She does not experience vivid dreams now, neither is  sleep paralysis reported,nor dream intrusion. She also denies any cataplexy to be present at this time. Sleep medical history and family sleep history:  She failed ritalin, Modafinil and Armodafinil. Only XYREM let to success.  Social history: married to a now retired Musician, 3 daughters age  3, 52, 61.  The patient was not on Xyrem at the time of these pregnancies.  Heidi Gross is a 47 y.o. female , who is seen here as in revisit after  Dr. Doren [facility] for transfer of care for narcolepsy. [name] is a retired from [employer] and now at [employer] .   We are meeting today after recent testing.  [name] and she is now taking 4.5 g at bedtime.  She takes a second dose about 2-1/2 hours later.  "Xyrem has made a huge difference" in her life .   12-26-2017, RV for Heidi Gross, 47 year old patient and longtime XYREM user since 2008.  She had to go off Xyrem after she suffered a leg fracture in July, had surgery and was on pain medications- she had after 14 days to re-titrate XYREM and now used 3.75 gram twice nightly .  I have the pleasure of seeing her today for a routine follow-up.  The patient had undergone a nocturnal polysomnography in 2016 of which the results have been scanned into the epic system, she continues to do very well on Xyrem endorsing the  fatigue severity score at 15 points and the Epworth sleepiness score at only 2 points.    Review of Systems: Out of a complete 14 system review, the patient complains of only the following symptoms, and all other reviewed systems are negative. "XYREM changed my life" Adderall and ritalin failed to treat symptoms.   Epworth score 2/ 24 on XYREM, Fatigue severity score 15/ 63 points  , depression score 1/15    Social History   Socioeconomic History  . Marital status: Married    Spouse name: Not on file  . Number of children: Not on file  . Years of education: Not on file  . Highest education level: Not on file  Social Needs  . Financial resource strain: Not on file  . Food insecurity - worry: Not on file  . Food insecurity - inability: Not on file  . Transportation needs - medical: Not on file  . Transportation needs - non-medical: Not on file  Occupational History  . Not on file  Tobacco Use  . Smoking status: Former Research scientist (life sciences)  . Smokeless tobacco: Never Used  Substance and Sexual Activity  . Alcohol use: Not on file  . Drug use: No  . Sexual activity: Not on file  Other Topics Concern  . Not on file  Social History Narrative  . Not on file    Family History  Problem Relation Age of Onset  . Alcohol abuse Father   . Diabetes Maternal Uncle   . Arthritis Maternal Grandmother   . Cancer Maternal Grandfather     Past Medical History:  Diagnosis Date  . Anxiety   . Blood transfusion without reported diagnosis   . Neuromuscular disorder [facility])     Past Surgical History:  Procedure Laterality Date  . AUGMENTATION MAMMAPLASTY    . CESAREAN SECTION      Current Outpatient Medications  Medication Sig Dispense Refill  . cephALEXin (KEFLEX) 250 MG capsule Take 1 capsule (250 mg total) by mouth daily. 90 capsule 1  . DULoxetine (CYMBALTA) 60 MG capsule Take 1 capsule (60 mg total) by mouth 2 (two) times daily. 180 capsule 1  . Sodium Oxybate (XYREM) 500 MG/ML SOLN Take  3.75 mg by mouth 2 (two) times daily. Take by mouth  diluted in 79m of water at bedtime,repeat 2 1/2 to 4 hours later     No current facility-administered medications for this visit.     Allergies as of 12/22/2016 - Review Complete 12/22/2016  Allergen Reaction Noted  . Sulfa antibiotics  12/22/2016    Vitals: BP 125/81   Pulse 68   Ht '5\' 3"'  (1.6 m)   Wt 121 lb (54.9 kg)   BMI 21.43 kg/m  Last Weight:  Wt Readings from Last 1 Encounters:  12/22/16 121 lb (54.9 kg)    TRR:NHAF mass index is 21.43 kg/m.     Last Height:   Ht Readings from Last 1 Encounters:  12/22/16 '5\' 3"'  (1.6 m)    Physical exam:  General: The patient is awake, alert and appears not in acute distress. The patient is well groomed. Head: Normocephalic, atraumatic. Neck is supple. Mallampati 2  neck circumference:13.25" . Nasal airflow patent ,Cardiovascular:  Regular rate and rhythm . Respiratory: Lungs are clear to auscultation. Skin:  Without evidence of edema, or rash Trunk: BMI is 21.5.  The patient's posture is erect  Neurologic exam : The patient is awake and alert, oriented to place and time.  Attention span & concentration ability appears normal.   Cranial nerves: Pupils are equal and briskly reactive to light, and extraocular movements  in vertical and horizontal planes were intact without nystagmus. Visual fields by finger perimetry are intact.Hearing to finger rub intact.  Facial sensation intact to fine touch, and temperature. Facial motor strength is symmetric and tongue in midline. Shoulder shrug  symmetrical.  Motor exam:   Normal tone, muscle bulk and symmetric strength in all extremities. Coordination: Penmanship unchanged. Finger-to-nose maneuver normal without evidence of ataxia, dysmetria or tremor.Gait and station: Patient walks without assistive device Deep tendon reflexes: in the upper and lower extremities are symmetric and intact.   Assessment:  After physical and neurologic  examination, review of laboratory studies,  Personal review of imaging studies, reports of other /same  Imaging studies, results of polysomnography and / or neurophysiology testing and pre-existing records as far as provided in visit., my assessment is :    1) Mrs. Tilley was diagnosed by a PSG followed by an MSLT in [place], but I do not have access to the original records.    However I do not doubt that she was put on Xyrem because her test was positive for narcolepsy.   Her most recent sleep study, which is available to me today, was performed in [place], [place]. It was a PSG not followed by an M SLT and performed while the patient was on Xyrem.  It took place on 30 October 2016.  Her mother has narcolepsy.  Mrs. Havrilla has reported that Xyrem changed her life-  that she failed several other medications before Xyrem was initiated.   I spent more than 25 minutes of face to face time with the patient.  Greater than 50% of time was spent in counseling and coordination of care. We have discussed the diagnosis and differential and I answered the patient's questions.    Plan:  Treatment plan and additional workup : refilled XYREM.  3.75 gram- twice at night.   HLA narcolepsy testing was negative , MSLT will not be able unless she weans off XYREM and other medications first.  RV with NP 6 month alternating between MD and NP. Need  labs every 6 month - REMS program.     Larey Seat, MD 79/04/8331 , 83:29 AM  Certified in Neurology by ABPN Certified in [place] by [facility] [address], [place], [place] [address]

## 2017-12-27 LAB — COMPREHENSIVE METABOLIC PANEL
A/G RATIO: 2.4 — AB (ref 1.2–2.2)
ALK PHOS: 44 IU/L (ref 39–117)
ALT: 12 IU/L (ref 0–32)
AST: 24 IU/L (ref 0–40)
Albumin: 4.6 g/dL (ref 3.5–5.5)
BILIRUBIN TOTAL: 0.5 mg/dL (ref 0.0–1.2)
BUN/Creatinine Ratio: 10 (ref 9–23)
BUN: 9 mg/dL (ref 6–24)
CHLORIDE: 100 mmol/L (ref 96–106)
CO2: 24 mmol/L (ref 20–29)
Calcium: 9.7 mg/dL (ref 8.7–10.2)
Creatinine, Ser: 0.86 mg/dL (ref 0.57–1.00)
GFR calc Af Amer: 93 mL/min/{1.73_m2} (ref >59)
GFR calc non Af Amer: 81 mL/min/{1.73_m2} (ref >59)
GLUCOSE: 80 mg/dL (ref 65–99)
Globulin, Total: 1.9 g/dL (ref 1.5–4.5)
POTASSIUM: 3.9 mmol/L (ref 3.5–5.2)
Sodium: 141 mmol/L (ref 134–144)
Total Protein: 6.5 g/dL (ref 6.0–8.5)

## 2017-12-27 LAB — CBC WITH DIFFERENTIAL/PLATELET
Basophils Absolute: 0.1 10*3/uL (ref 0.0–0.2)
Basos: 1 %
EOS (ABSOLUTE): 0.1 10*3/uL (ref 0.0–0.4)
Eos: 1 %
Hematocrit: 41 % (ref 34.0–46.6)
Hemoglobin: 13.8 g/dL (ref 11.1–15.9)
Immature Grans (Abs): 0 10*3/uL (ref 0.0–0.1)
Immature Granulocytes: 0 %
LYMPHS ABS: 1.9 10*3/uL (ref 0.7–3.1)
Lymphs: 23 %
MCH: 31.7 pg (ref 26.6–33.0)
MCHC: 33.7 g/dL (ref 31.5–35.7)
MCV: 94 fL (ref 79–97)
MONOS ABS: 0.4 10*3/uL (ref 0.1–0.9)
Monocytes: 5 %
NEUTROS ABS: 5.9 10*3/uL (ref 1.4–7.0)
Neutrophils: 70 %
PLATELETS: 212 10*3/uL (ref 150–450)
RBC: 4.35 x10E6/uL (ref 3.77–5.28)
RDW: 11.7 % — ABNORMAL LOW (ref 12.3–15.4)
WBC: 8.3 10*3/uL (ref 3.4–10.8)

## 2018-02-09 ENCOUNTER — Other Ambulatory Visit: Payer: Self-pay | Admitting: *Deleted

## 2018-02-09 DIAGNOSIS — M797 Fibromyalgia: Secondary | ICD-10-CM

## 2018-02-09 MED ORDER — DULOXETINE HCL 60 MG PO CPEP: 60.0000 mg | ORAL_CAPSULE | Freq: Two times a day (BID) | ORAL | 0 refills | Status: DC

## 2018-03-09 ENCOUNTER — Ambulatory Visit (INDEPENDENT_AMBULATORY_CARE_PROVIDER_SITE_OTHER): Admitting: Family Medicine

## 2018-03-09 ENCOUNTER — Encounter: Payer: Self-pay | Admitting: Family Medicine

## 2018-03-09 ENCOUNTER — Other Ambulatory Visit: Payer: Self-pay

## 2018-03-09 ENCOUNTER — Other Ambulatory Visit: Payer: Self-pay | Admitting: Family Medicine

## 2018-03-09 VITALS — BP 102/58 | HR 66 | Temp 98.6°F | Resp 14 | Ht 63.0 in | Wt 124.0 lb

## 2018-03-09 DIAGNOSIS — M7712 Lateral epicondylitis, left elbow: Secondary | ICD-10-CM

## 2018-03-09 DIAGNOSIS — Z1231 Encounter for screening mammogram for malignant neoplasm of breast: Secondary | ICD-10-CM

## 2018-03-09 DIAGNOSIS — M797 Fibromyalgia: Secondary | ICD-10-CM | POA: Diagnosis not present

## 2018-03-09 DIAGNOSIS — G47419 Narcolepsy without cataplexy: Secondary | ICD-10-CM

## 2018-03-09 DIAGNOSIS — M7711 Lateral epicondylitis, right elbow: Secondary | ICD-10-CM | POA: Diagnosis not present

## 2018-03-09 DIAGNOSIS — S76302A Unspecified injury of muscle, fascia and tendon of the posterior muscle group at thigh level, left thigh, initial encounter: Secondary | ICD-10-CM | POA: Diagnosis not present

## 2018-03-09 DIAGNOSIS — Z87440 Personal history of urinary (tract) infections: Secondary | ICD-10-CM

## 2018-03-09 MED ORDER — MELOXICAM 7.5 MG PO TABS: 7.5000 mg | ORAL_TABLET | Freq: Every day | ORAL | 1 refills | Status: DC

## 2018-03-09 NOTE — Progress Notes (Signed)
   Subjective:    Patient ID: Heidi Gross, female    DOB: [birth year], 48 y.o.   MRN: [id number]  Patient presents for Follow-up (is not fasting) and Arm Pain (B Tennis Elbow)  Patient here to follow-up chronic medical problems.  She is a previous patient of my PA who is left the practice. Occasions and history reviewed Fibromyalgia- symptoms started 6-7 years ago, diagnosed 3 years ago, has been cymbalta and worksout 5 days week ( bootcamp)  Narcolepsy followed by neurology on Xyrem   Bilat arms, history of tennis elbow- has tried ibuprofen, using ice , pain worse in right arm, pain waking her up a night   - no neck pain, no wrist/hand pain  she does cross fit 5 days a week   Has history chronic UTI- taking keflex 250mg  at bedtime, taking probiotics     Mammogram scheduled for next month    Due for PAP smear/ CPE    Pain in back of left leg for weeks/ feels knot at times, when she bends over, no spcific history of injury, but has not stopped her workout routine with the injury     Does not know paternal history    Review Of Systems:  GEN- denies fatigue, fever, weight loss,weakness, recent illness HEENT- denies eye drainage, change in vision, nasal discharge, CVS- denies chest pain, palpitations RESP- denies SOB, cough, wheeze ABD- denies N/V, change in stools, abd pain GU- denies dysuria, hematuria, dribbling, incontinence MSK- + joint pain, muscle aches, injury Neuro- denies headache, dizziness, syncope, seizure activity       Objective:    BP (!) 102/58   Pulse 66   Temp 98.6 F (37 C) (Oral)   Resp 14   Ht 5\' 3"  (1.6 m)   Wt 124 lb (56.2 kg)   LMP 02/26/2018 Comment: irregular  SpO2 100%   BMI 21.97 kg/m  GEN- NAD, alert and oriented x3 HEENT- PERRL, EOMI, non injected sclera, pink conjunctiva, MMM, oropharynx clear Neck- Supple, no thyromegaly CVS- RRR, no murmur RESP-CTAB MSK- bilat arms, normal inspection, FROM shoulders, elbows, TTP bilat lateral  epicondyles, pain with ROM with resistance at elbow/forearm, Spine NT, good ROM, good ROM KNEES/HIPS, Mild ttp left hamstring, no nodule or mass palpated  EXT- No edema Pulses- Radial, DP- 2+        Assessment & Plan:      Problem List Items Addressed This Visit      Unprioritized   Fibromyalgia    Continue cymbalta doing well with the medication      Relevant Medications   DULoxetine (CYMBALTA) 60 MG capsule   History of recurrent UTIs    Keflex daily with probiotic       Narcolepsy    Treatment per neurology       Other Visit Diagnoses    Lateral epicondylitis of both elbows    -  Primary   overuse with her workouts and hamstring injury, discussed change to workouts, start mobic, tennis elbow sleeves, referral to ortho   Relevant Medications   meloxicam (MOBIC) 7.5 MG tablet   Left hamstring injury, initial encounter          Note: This dictation was prepared with Dragon dictation along with smaller phrase technology. Any transcriptional errors that result from this process are unintentional.

## 2018-03-09 NOTE — Patient Instructions (Addendum)
Referral to orthopedics  meloxicam sent to pharmacy  Use tennis elbow braces  Schedule physical

## 2018-03-11 ENCOUNTER — Encounter: Payer: Self-pay | Admitting: Family Medicine

## 2018-03-11 MED ORDER — DULOXETINE HCL 60 MG PO CPEP: 60.0000 mg | ORAL_CAPSULE | Freq: Two times a day (BID) | ORAL | 1 refills | Status: DC

## 2018-03-11 NOTE — Assessment & Plan Note (Signed)
Continue cymbalta doing well with the medication

## 2018-03-11 NOTE — Assessment & Plan Note (Signed)
Treatment per neurology

## 2018-03-11 NOTE — Assessment & Plan Note (Signed)
Keflex daily with probiotic

## 2018-03-27 ENCOUNTER — Telehealth: Payer: Self-pay | Admitting: Neurology

## 2018-03-27 NOTE — Telephone Encounter (Signed)
PA SUBMITTED ON COVER MY MEDS/[employer] S[facility]TS FOR THE PT. ZOX:[id number] APPROVED IMMEDIATELY UNTIL 04/07/2019. [id number]:[id number]

## 2018-04-06 ENCOUNTER — Ambulatory Visit: Payer: PRIVATE HEALTH INSURANCE

## 2018-04-18 ENCOUNTER — Other Ambulatory Visit: Payer: Self-pay | Admitting: *Deleted

## 2018-04-18 MED ORDER — CEPHALEXIN 250 MG PO CAPS: 250.0000 mg | ORAL_CAPSULE | Freq: Every day | ORAL | 4 refills | Status: DC

## 2018-04-24 ENCOUNTER — Ambulatory Visit
Admission: RE | Admit: 2018-04-24 | Discharge: 2018-04-24 | Disposition: A | Discharge status: Home / Self Care | Source: Ambulatory Visit | Attending: Family Medicine | Admitting: Family Medicine

## 2018-04-24 DIAGNOSIS — Z1231 Encounter for screening mammogram for malignant neoplasm of breast: Secondary | ICD-10-CM

## 2018-05-01 ENCOUNTER — Encounter: Payer: Self-pay | Admitting: Family Medicine

## 2018-05-01 ENCOUNTER — Other Ambulatory Visit: Payer: Self-pay

## 2018-05-01 ENCOUNTER — Ambulatory Visit (INDEPENDENT_AMBULATORY_CARE_PROVIDER_SITE_OTHER): Admitting: Family Medicine

## 2018-05-01 VITALS — BP 102/68 | HR 82 | Temp 98.5°F | Resp 12 | Ht 63.0 in | Wt 122.0 lb

## 2018-05-01 DIAGNOSIS — Z0000 Encounter for general adult medical examination without abnormal findings: Secondary | ICD-10-CM

## 2018-05-01 NOTE — Progress Notes (Signed)
   Subjective:    Patient ID: Heidi Gross, female    DOB: 1970-12-04, 48 y.o.   MRN: [id number]  Patient presents for Annual Exam (is not fasting- defer PAP)  Pt here for CPE  Mammogram UTD  Due for PAP Smear end of he year     Seen by orthopedics had bilat tennis elbow shots, currently doing PT      lEG PAin stretching has improved     Due for fasting labs    iMMUNIZATIONS utd    Followed by dentist and eye doctor      No new concerns   Review Of Systems:  GEN- denies fatigue, fever, weight loss,weakness, recent illness HEENT- denies eye drainage, change in vision, nasal discharge, CVS- denies chest pain, palpitations RESP- denies SOB, cough, wheeze ABD- denies N/V, change in stools, abd pain GU- denies dysuria, hematuria, dribbling, incontinence MSK- denies joint pain, muscle aches, injury Neuro- denies headache, dizziness, syncope, seizure activity       Objective:    BP 102/68   Pulse 82   Temp 98.5 F (36.9 C) (Oral)   Resp 12   Ht 5\' 3"  (1.6 m)   Wt 122 lb (55.3 kg)   LMP 04/16/2018 Comment: regular  SpO2 99%   BMI 21.61 kg/m  GEN- NAD, alert and oriented x3 HEENT- PERRL, EOMI, non injected sclera, pink conjunctiva, MMM, oropharynx clear Neck- Supple, no thyromegaly CVS- RRR, no murmur RESP-CTAB ABD-NABS,soft,NT,ND EXT- No edema Pulses- Radial, DP- 2+        Assessment & Plan:      Problem List Items Addressed This Visit    None    Visit Diagnoses    Routine general medical examination at a health care facility    -  Primary   CPE done, non fasting labs, immunizations UTD, mammogram normal, plan for PAP at next visit in fall   Relevant Orders   CBC with Differential/Platelet   Comprehensive metabolic panel   Lipid panel   TSH      Note: This dictation was prepared with Dragon dictation along with smaller phrase technology. Any transcriptional errors that result from this process are unintentional.

## 2018-05-01 NOTE — Patient Instructions (Signed)
F/U 6 months for PAP Smear

## 2018-05-02 LAB — COMPREHENSIVE METABOLIC PANEL
AG Ratio: 2.1 (calc) (ref 1.0–2.5)
ALBUMIN MSPROF: 4.5 g/dL (ref 3.6–5.1)
ALT: 13 U/L (ref 6–29)
AST: 23 U/L (ref 10–35)
Alkaline phosphatase (APISO): 35 U/L (ref 31–125)
BUN: 11 mg/dL (ref 7–25)
CO2: 26 mmol/L (ref 20–32)
Calcium: 10.4 mg/dL — ABNORMAL HIGH (ref 8.6–10.2)
Chloride: 105 mmol/L (ref 98–110)
Creat: 0.91 mg/dL (ref 0.50–1.10)
Globulin: 2.1 g/dL (calc) (ref 1.9–3.7)
Glucose, Bld: 99 mg/dL (ref 65–99)
Potassium: 5.6 mmol/L — ABNORMAL HIGH (ref 3.5–5.3)
Sodium: 144 mmol/L (ref 135–146)
TOTAL PROTEIN: 6.6 g/dL (ref 6.1–8.1)
Total Bilirubin: 0.9 mg/dL (ref 0.2–1.2)

## 2018-05-02 LAB — CBC WITH DIFFERENTIAL/PLATELET
ABSOLUTE MONOCYTES: 448 {cells}/uL (ref 200–950)
Basophils Absolute: 38 cells/uL (ref 0–200)
Basophils Relative: 0.5 %
Eosinophils Absolute: 129 cells/uL (ref 15–500)
Eosinophils Relative: 1.7 %
HEMATOCRIT: 40.3 % (ref 35.0–45.0)
Hemoglobin: 13.5 g/dL (ref 11.7–15.5)
Lymphs Abs: 2143 cells/uL (ref 850–3900)
MCH: 32.2 pg (ref 27.0–33.0)
MCHC: 33.5 g/dL (ref 32.0–36.0)
MCV: 96.2 fL (ref 80.0–100.0)
MPV: 12.4 fL (ref 7.5–12.5)
Monocytes Relative: 5.9 %
NEUTROS PCT: 63.7 %
Neutro Abs: 4841 cells/uL (ref 1500–7800)
Platelets: 191 10*3/uL (ref 140–400)
RBC: 4.19 10*6/uL (ref 3.80–5.10)
RDW: 11.9 % (ref 11.0–15.0)
Total Lymphocyte: 28.2 %
WBC: 7.6 10*3/uL (ref 3.8–10.8)

## 2018-05-02 LAB — LIPID PANEL
Cholesterol: 176 mg/dL (ref <200)
HDL: 80 mg/dL (ref >=50)
LDL CHOLESTEROL (CALC): 80 mg/dL
Non-HDL Cholesterol (Calc): 96 mg/dL (calc) (ref <130)
Total CHOL/HDL Ratio: 2.2 (calc) (ref <5.0)
Triglycerides: 76 mg/dL (ref <150)

## 2018-05-02 LAB — TSH: TSH: 0.99 mIU/L

## 2018-05-03 ENCOUNTER — Other Ambulatory Visit: Payer: Self-pay | Admitting: Family Medicine

## 2018-05-03 DIAGNOSIS — E875 Hyperkalemia: Secondary | ICD-10-CM

## 2018-05-15 ENCOUNTER — Other Ambulatory Visit: Payer: Self-pay

## 2018-05-15 ENCOUNTER — Other Ambulatory Visit

## 2018-05-15 DIAGNOSIS — E875 Hyperkalemia: Secondary | ICD-10-CM

## 2018-05-16 LAB — BASIC METABOLIC PANEL
BUN: 13 mg/dL (ref 7–25)
CO2: 28 mmol/L (ref 20–32)
Calcium: 9.3 mg/dL (ref 8.6–10.2)
Chloride: 103 mmol/L (ref 98–110)
Creat: 0.86 mg/dL (ref 0.50–1.10)
Glucose, Bld: 93 mg/dL (ref 65–99)
Potassium: 5.1 mmol/L (ref 3.5–5.3)
Sodium: 137 mmol/L (ref 135–146)

## 2018-05-29 ENCOUNTER — Other Ambulatory Visit: Payer: Self-pay | Admitting: Neurology

## 2018-05-29 MED ORDER — SODIUM OXYBATE 500 MG/ML PO SOLN: ORAL | 5 refills | Status: DC

## 2018-06-19 ENCOUNTER — Telehealth: Payer: Self-pay | Admitting: Neurology

## 2018-06-19 NOTE — Telephone Encounter (Signed)
Due to current COVID 19 pandemic, our office is severely reducing in office visits until further notice, in order to minimize the risk to our patients and healthcare providers.   Called patient to offer a virtual visit for 5/12 appt. Patient accepted and verbalized understanding of the doxy.me process. Patient understands that she will receive an e-mail with link and directions. Patient understands that she will receive a call from RN and front office staff.  Pt understands that although there may be some limitations with this type of visit, we will take all precautions to reduce any security or privacy concerns.  Pt understands that this will be treated like an in office visit and we will file with pt's insurance, and there may be a patient responsible charge related to this service.

## 2018-06-25 ENCOUNTER — Encounter: Payer: Self-pay | Admitting: Neurology

## 2018-06-25 NOTE — Telephone Encounter (Signed)

## 2018-06-26 ENCOUNTER — Encounter: Payer: Self-pay | Admitting: Neurology

## 2018-06-26 ENCOUNTER — Ambulatory Visit (INDEPENDENT_AMBULATORY_CARE_PROVIDER_SITE_OTHER): Admitting: Neurology

## 2018-06-26 ENCOUNTER — Other Ambulatory Visit: Payer: Self-pay

## 2018-06-26 DIAGNOSIS — Z5181 Encounter for therapeutic drug level monitoring: Secondary | ICD-10-CM

## 2018-06-26 MED ORDER — SODIUM OXYBATE 500 MG/ML PO SOLN: ORAL | 5 refills | Status: DC

## 2018-06-26 NOTE — Patient Instructions (Signed)
As discussed, Xyrem has to be taken with very mindful caution: Taking Xyrem correctly is key. This means, take it only when you are fully ready to fall asleep, while in bed and refrain from doing any other activities, even brushing  your teeth after taking your first dose. The second dose will be about 2-1/2-4 hours after his first dose. You can go to the bathroom before your 2nd dose. Take your first dose, when actually IN BED, ready to sleep. No sitting up in bed, NO reading, NO using the cell phone or computer, NO getting up to use the bathroom. Take care of everything BEFORE sleep time. Try NOT to skip the second dose as the Xyrem is not going to stay in your system long enough with only one dose. Do not drink alcohol with Xyrem. If you do drink Alcohol, you cannot take your Xyrem doses that night.   

## 2018-06-26 NOTE — Progress Notes (Signed)
SLEEP MEDICINE CLINIC   Provider:  Larey Seat, MD   Primary Care Physician:  Rennis Golden  Referring Provider: Rennis Golden   Virtual Visit via Video Note  I connected with Hero Mccathern on 06/26/18 at  1:30 PM EDT by a video enabled telemedicine application and verified that I am speaking with the correct person using two identifiers.  Location: Patient: at home  Provider: at office     I discussed the limitations of evaluation and management by telemedicine and the availability of in person appointments. The patient expressed understanding and agreed to proceed.  History of Present Illness:    Observations/Objective:  HLA narcolepsy negative.   MSLT repeat not possible -not a valid test on medication. Has found her original sleep study which presumably established the diagnosis of narcolepsy_  Dated 2006/ [place]. 432 minutes , sleep efficiency of over 90%. REM latency of 114 minutes.  RDI of 1.5/h, no oxygen desat.  PLM 9.9/h.   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  Added sinemet for RLS.   MSLT- no REM sleep onsets.   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 Has no cataplexy, but had sleep attacks.  Her physician at the time agreed to a trial of Xyrem, and she has been on ever since. [employer] agreed to cover the medication and renewed in 48.       Chief Complaint  Patient presents with  . New Patient (Initial Visit)    2 sleep studied done, most recent in 2016. pt has been on xyrem since 2008/09. presents today to establish care to continue treatment for her narcolepsy.     HPI:  Heidi Gross is a 48 y.o. female was seen here in a referral from [place] for transfer of care, the patient carries a diagnosis of narcolepsy since age 48.   She was officially diagnosed by proper testing about 18 years ago. Chief complaint according to patient : I need XYREM refilled.   She was followed by the  [place], [place], [facility] of Dr. Gershon Mussel [place]. She did undergo a diagnostic polysomnogram last on 31 October 2014- sleep study documented a total sleep time of 390 min.- the test was performed while the patient was on Xyrem and did therefore not to document any REM sleep.  There was no significant apnea noted;  her AHI was 1.2/h, there was no oxygen desaturation, nadir of oxygen was 92% SPO2.  She had frequent periodic limb movements of sleep but these were also only relating to arousals at 3.3/h, they did not repeat an M SLT, but she had a positive M SLT several years earlier ( ? Copies needed ) .   The conclusion of her treating physician was that she continues to be treated with Xyrem, and that she may try a low dose dopamine agonist for the treatment of PLM's.  Before diagnosis, she went ot bed any night between 9-10 PM and would wake only at noon the following day, she cold not resist the urge to fall asleep, it was dangerous for her to drive and she avoided driving because of fear of falling asleep at the wheel.  She used to drive with the radio on loud volume, the window open, needing  to eat or drink something to keep her from sleeping.    Sleep habits are as follows:The patient lived right outside [place] until she moved to [facility], on September 19, 2016, following her husband's  retirement from the [employer]. She is here to continue her narcolepsy care.  She has been adherent with the Xyrem intake guidelines, in bed when she takes a dose.  Not experience parasomnias.  She usually takes the medication at about 9:30 PM will be asleep by 10 PM, wakes up about 1 AM to take her second dose.  She will sleep through until the morning hour, rises by 6 AM.  She gets a good 6-8 hours of nocturnal sleep.  She does not experience vivid dreams now, neither is  sleep paralysis reported,nor dream intrusion. She also denies any cataplexy to be present at this time. Sleep medical history and family  sleep history:  She failed ritalin, Modafinil and Armodafinil. Only XYREM let to success.  Social history: married to a now retired Musician, 3 daughters age  59, 37, 6.  The patient was not on Xyrem at the time of these pregnancies.  Heidi Gross is a 48 y.o. female , who is seen here as in revisit after  Dr. Doren Custard for transfer of care for narcolepsy. Mr Pesci is a retired from [employer] and now at [employer] .   We are meeting today after recent testing.  Mrs. Clements has been prescribed to to restart on Xyrem-sodium oxybate and she is now taking 4.5 g at bedtime.  She takes a second dose about 2-1/2 hours later.  "Xyrem has made a huge difference" in her life .   12-26-2017, RV for Heidi Gross, 48 year old patient and longtime XYREM user since 2008.  She had to go off Xyrem after she suffered a leg fracture in July, had surgery and was on pain medications- she had after 14 days to re-titrate XYREM and now used 3.75 gram twice nightly .  I have the pleasure of seeing her today for a routine follow-up.  The patient had undergone a nocturnal polysomnography in 2016 of which the results have been scanned into the epic system, she continues to do very well on Xyrem endorsing the fatigue severity score at 15 points and the Epworth sleepiness score at only 2 points.    Review of Systems: Out of a complete 14 system review, the patient complains of only the following symptoms, and all other reviewed systems are negative. "XYREM changed my life" Adderall and ritalin failed to treat symptoms.   Epworth score 5/ 24 on XYREM,  06-26-2018, Fatigue severity score 15/ 63 points  , depression score 1/15    Social History   Socioeconomic History  . Marital status: Married    Spouse name: Not on file  . Number of children: Not on file  . Years of education: Not on file  . Highest education level: Not on file  Social Needs  . Financial resource strain: Not on file  . Food insecurity  - worry: Not on file  . Food insecurity - inability: Not on file  . Transportation needs - medical: Not on file  . Transportation needs - non-medical: Not on file  Occupational History  . Not on file  Tobacco Use  . Smoking status: Former Research scientist (life sciences)  . Smokeless tobacco: Never Used  Substance and Sexual Activity  . Alcohol use: Not on file  . Drug use: No  . Sexual activity: Not on file  Other Topics Concern  . Not on file  Social History Narrative  . Not on file    Family History  Problem Relation Age of Onset  . Alcohol abuse  Father   . Diabetes Maternal Uncle   . Arthritis Maternal Grandmother   . Cancer Maternal Grandfather     Past Medical History:  Diagnosis Date  . Anxiety   . Blood transfusion without reported diagnosis   . Neuromuscular disorder [facility])     Past Surgical History:  Procedure Laterality Date  . AUGMENTATION MAMMAPLASTY    . CESAREAN SECTION      Current Outpatient Medications  Medication Sig Dispense Refill  . cephALEXin (KEFLEX) 250 MG capsule Take 1 capsule (250 mg total) by mouth daily. 90 capsule 1  . DULoxetine (CYMBALTA) 60 MG capsule Take 1 capsule (60 mg total) by mouth 2 (two) times daily. 180 capsule 1  . Sodium Oxybate (XYREM) 500 MG/ML SOLN Take 3.75 mg by mouth 2 (two) times daily. Take by mouth  diluted in 15m of water at bedtime,repeat 2 1/2 to 4 hours later     No current facility-administered medications for this visit.     Allergies as of 12/22/2016 - Review Complete 12/22/2016  Allergen Reaction Noted  . Sulfa antibiotics  12/22/2016    Vitals: BP 125/81   Pulse 68   Ht _0  (1.6 m)   Wt 121 lb (54.9 kg)   BMI 21.43 kg/m  Last Weight:  Wt Readings from Last 1 Encounters:  12/22/16 121 lb (54.9 kg)    BLKJ:ZPHXmass index is 21.43 kg/m.     Last Height:   Ht Readings from Last 1 Encounters:  12/22/16 _1  (1.6 m)    Observation- no loss of smell or taste, no HA and no loss of appetite.   General: The  patient is awake, alert and appears not in acute distress. The patient is well groomed. Head: Normocephalic, atraumatic. Neck is supple. Mallampati 2  neck circumference:13.25" . Nasal airflow patent ,Cardiovascular:  Regular rate and rhythm . Respiratory: holds breath for 20 seconds.   Skin:  Without evidence of edema, or rash Trunk: BMI is 21.5.  The patient's posture is erect  Neurologic exam : The patient is awake and alert, oriented to place and time.  Attention span & concentration ability appears normal.   Cranial nerves: Pupils are equal . Facial motor strength is symmetric and tongue in midline. Shoulder shrug  symmetrical.  Motor exam:   Normal ROM  in all extremities. Coordination: Penmanship unchanged.   Follow Up Instructions:  refilled XYREM, should there be a re investigation, will consider SUNOSI>     I discussed the assessment and treatment plan with the patient. The patient was provided an opportunity to ask questions and all were answered. The patient agreed with the plan and demonstrated an understanding of the instructions.   The patient was advised to call back or seek an in-person evaluation if the symptoms worsen or if the condition fails to improve as anticipated.  I provided 21 minutes of non-face-to-face time during this encounter.   CLarey Seat MD RV with NP in 6 month, face to face with CMET and CBC diff.     CLarey Seat MD 150/56/9794, 180:16AM  Certified in Neurology by ABPN Certified in [facility] [facility] [address] [place] [address]

## 2018-07-23 ENCOUNTER — Ambulatory Visit: Admitting: Family Medicine

## 2018-09-18 ENCOUNTER — Other Ambulatory Visit: Payer: Self-pay | Admitting: Family Medicine

## 2018-09-18 DIAGNOSIS — M797 Fibromyalgia: Secondary | ICD-10-CM

## 2018-11-06 ENCOUNTER — Other Ambulatory Visit: Payer: Self-pay | Admitting: Neurology

## 2018-11-06 MED ORDER — XYREM 500 MG/ML PO SOLN: ORAL | 5 refills | Status: DC

## 2018-12-24 ENCOUNTER — Encounter: Payer: Self-pay | Admitting: Adult Health

## 2018-12-24 ENCOUNTER — Encounter: Admitting: Adult Health

## 2018-12-24 ENCOUNTER — Other Ambulatory Visit: Payer: Self-pay

## 2018-12-24 ENCOUNTER — Telehealth: Payer: Self-pay

## 2018-12-24 NOTE — Telephone Encounter (Signed)
I went to room pt for provider. PT had mask in her hand and not on her face. Pt stated she cant wear the mask fully but can place it in front of her face. I explain to pt that I will speak with the provider. Megan NP was told of pt unable to wear mask properly and fully. She stated pt can r/s for a mychart video visit. I text pt mychart link to create an account PT receive link and will create an account. I also gave pt mychart IT number for any assistance. PT r/s with MEgan NP on 12/25/2018 for mychart video visit.

## 2018-12-25 ENCOUNTER — Telehealth (INDEPENDENT_AMBULATORY_CARE_PROVIDER_SITE_OTHER): Admitting: Adult Health

## 2018-12-25 DIAGNOSIS — G47419 Narcolepsy without cataplexy: Secondary | ICD-10-CM

## 2018-12-25 NOTE — Progress Notes (Signed)
This encounter was created in error - please disregard.

## 2018-12-25 NOTE — Progress Notes (Signed)
PATIENT: Heidi Gross DOB: 1970/11/20  REASON FOR VISIT: follow up HISTORY FROM: patient  Virtual Visit via Video Note  I connected with Heidi Gross on 12/25/18 at  2:30 PM EST by a video enabled telemedicine application located remotely at [facility] and verified that I am speaking with the correct person using two identifiers who was located at their own home.   I discussed the limitations of evaluation and management by telemedicine and the availability of in person appointments. The patient expressed understanding and agreed to proceed.   PATIENT: Heidi Gross DOB: August 23, 1970  REASON FOR VISIT: follow up HISTORY FROM: patient  HISTORY OF PRESENT ILLNESS: Today December 25, 2018: Heidi Gross is a 48 year old female with a history of narcolepsy.  She returns today for follow-up.  She continues on Xyrem and is tolerating it well.  She denies falling asleep while driving or at work.  Denies any cataplectic events.  She had blood work in March with her primary care and sodium level was in normal range.  Overall she feels that she is doing well.  She returns today for evaluation.  HISTORY 12-26-2017, RV for Heidi Gross, 48 year old patient and longtime XYREM user since 2008.  She had to go off Xyrem after she suffered a leg fracture in July, had surgery and was on pain medications- she had after 14 days to re-titrate XYREM and now used 3.75 gram twice nightly .  I have the pleasure of seeing her today for a routine follow-up.  The patient had undergone a nocturnal polysomnography in 2016 of which the results have been scanned into the epic system, she continues to do very well on Xyrem endorsing the fatigue severity score at 15 points and the Epworth sleepiness score at only 2 points.   REVIEW OF SYSTEMS: Out of a complete 14 system review of symptoms, the patient complains only of the following symptoms, and all other reviewed systems are negative.   See HPI  ALLERGIES: Allergies  Allergen Reactions  . Sulfa Antibiotics     HOME MEDICATIONS: Outpatient Medications Prior to Visit  Medication Sig Dispense Refill  . cephALEXin (KEFLEX) 250 MG capsule Take 1 capsule (250 mg total) by mouth daily. Prophylactic 90 capsule 4  . DULoxetine (CYMBALTA) 60 MG capsule TAKE 1 CAPSULE TWICE A DAY 180 capsule 3  . Sodium Oxybate (XYREM) 500 MG/ML SOLN Take 3.75 grams po diluted in 36ml of water at bedtime,repeat after minimum of 90 minutes and max 4 hours later. 270 mL 5   No facility-administered medications prior to visit.     PAST MEDICAL HISTORY: Past Medical History:  Diagnosis Date  . Anxiety   . Blood transfusion without reported diagnosis   . Neuromuscular disorder (HCC)     PAST SURGICAL HISTORY: Past Surgical History:  Procedure Laterality Date  . AUGMENTATION MAMMAPLASTY    . CESAREAN SECTION      FAMILY HISTORY: Family History  Problem Relation Age of Onset  . Heart disease Mother   . Alcohol abuse Father   . Diabetes Maternal Uncle   . Arthritis Maternal Grandmother   . Cancer Maternal Grandfather     SOCIAL HISTORY: Social History   Socioeconomic History  . Marital status: Married    Spouse name: Not on file  . Number of children: Not on file  . Years of education: Not on file  . Highest education level: Not on file  Occupational History  . Not on file  Social Needs  . Financial resource strain: Not on file  . Food insecurity    Worry: Not on file    Inability: Not on file  . Transportation needs    Medical: Not on file    Non-medical: Not on file  Tobacco Use  . Smoking status: Former Research scientist (life sciences)  . Smokeless tobacco: Never Used  Substance and Sexual Activity  . Alcohol use: Not on file  . Drug use: No  . Sexual activity: Not on file  Lifestyle  . Physical activity    Days per week: Not on file    Minutes per session: Not on file  . Stress: Not on file  Relationships  . Social Product manager on phone: Not on file    Gets together: Not on file    Attends religious service: Not on file    Active member of club or organization: Not on file    Attends meetings of clubs or organizations: Not on file    Relationship status: Not on file  . Intimate partner violence    Fear of current or ex partner: Not on file    Emotionally abused: Not on file    Physically abused: Not on file    Forced sexual activity: Not on file  Other Topics Concern  . Not on file  Social History Narrative  . Not on file      PHYSICAL EXAM Generalized: Well developed, in no acute distress   Neurological examination  Mentation: Alert oriented to time, place, history taking. Follows all commands speech and language fluent Cranial nerve II-XII:Extraocular movements were full. Facial symmetry noted. uvula tongue midline. Head turning and shoulder shrug  were normal and symmetric. Motor: Good strength throughout subjectively per patient Sensory: Sensory testing is intact to soft touch on all 4 extremities subjectively per patient Reflexes: UTA  DIAGNOSTIC DATA (LABS, IMAGING, TESTING) - I reviewed patient records, labs, notes, testing and imaging myself where available.  Lab Results  Component Value Date   WBC 7.6 05/01/2018   HGB 13.5 05/01/2018   HCT 40.3 05/01/2018   MCV 96.2 05/01/2018   PLT 191 05/01/2018      Component Value Date/Time   NA 137 05/15/2018 1148   NA 141 12/26/2017 1430   K 5.1 05/15/2018 1148   CL 103 05/15/2018 1148   CO2 28 05/15/2018 1148   GLUCOSE 93 05/15/2018 1148   BUN 13 05/15/2018 1148   BUN 9 12/26/2017 1430   CREATININE 0.86 05/15/2018 1148   CALCIUM 9.3 05/15/2018 1148   PROT 6.6 05/01/2018 1524   PROT 6.5 12/26/2017 1430   ALBUMIN 4.6 12/26/2017 1430   AST 23 05/01/2018 1524   ALT 13 05/01/2018 1524   ALKPHOS 44 12/26/2017 1430   BILITOT 0.9 05/01/2018 1524   BILITOT 0.5 12/26/2017 1430   GFRNONAA 81 12/26/2017 1430   GFRAA 93 12/26/2017 1430    Lab Results  Component Value Date   CHOL 176 05/01/2018   HDL 80 05/01/2018   LDLCALC 80 05/01/2018   TRIG 76 05/01/2018   CHOLHDL 2.2 05/01/2018    Lab Results  Component Value Date   TSH 0.99 05/01/2018      ASSESSMENT AND PLAN 48 y.o. year old female  has a past medical history of Anxiety, Blood transfusion without reported diagnosis, and Neuromuscular disorder ([place]). here with:  Narcolepsy  -Continue Xyrem -Blood work in March was unremarkable  Overall patient is doing well.  She is advised  that if her symptoms worsen or she develops new symptoms she should let us know.  We will follow-up in 6 months or sooner if needed.   I spent 15 minutes with the patient. 50% of this time was spent reviewing plan of care   Butch PennyMegan Callyn Severtson, MSN, NP-C 12/25/2018, 2:49 PM [facility] [address], [address] [place] [address] [phone]

## 2019-01-16 ENCOUNTER — Ambulatory Visit: Admitting: Family Medicine

## 2019-01-16 ENCOUNTER — Encounter: Payer: Self-pay | Admitting: Family Medicine

## 2019-02-27 ENCOUNTER — Telehealth: Payer: Self-pay | Admitting: Neurology

## 2019-02-27 NOTE — Telephone Encounter (Signed)
PA completed through cover my meds/express scripts. [employer]:[id number], approved immediately Start Date:01/28/2019;Coverage End Date:02/27/2020;

## 2019-03-27 ENCOUNTER — Telehealth: Payer: Self-pay | Admitting: *Deleted

## 2019-03-27 NOTE — Telephone Encounter (Signed)
Received VM from patient.   Reports that she has been feeling very fatigued and sleeping a lot lately. Reports that she was tested for COVID on Monday, and test was negative.   Call placed to patient to inquire. LMTRC.

## 2019-03-27 NOTE — Telephone Encounter (Signed)
Received return call from patient.   States that she has felt very fatigued for a few weeks now. Reports that she can get up to clean the kitchen or do dishes and then feel like she needs a nap.   Advised that PCP recommended OV for labs/ evaluation as fatigue can be multifactorial. Appointment scheduled with NP for 03/28/2019.

## 2019-03-28 ENCOUNTER — Ambulatory Visit (INDEPENDENT_AMBULATORY_CARE_PROVIDER_SITE_OTHER): Admitting: Nurse Practitioner

## 2019-03-28 ENCOUNTER — Other Ambulatory Visit: Payer: Self-pay

## 2019-03-28 VITALS — BP 110/70 | HR 69 | Temp 98.3°F | Resp 18 | Ht 63.0 in | Wt 123.2 lb

## 2019-03-28 DIAGNOSIS — J310 Chronic rhinitis: Secondary | ICD-10-CM

## 2019-03-28 DIAGNOSIS — Z13220 Encounter for screening for lipoid disorders: Secondary | ICD-10-CM

## 2019-03-28 DIAGNOSIS — Z13228 Encounter for screening for other metabolic disorders: Secondary | ICD-10-CM | POA: Diagnosis not present

## 2019-03-28 DIAGNOSIS — Z130 Encounter for screening for diseases of the blood and blood-forming organs and certain disorders involving the immune mechanism: Secondary | ICD-10-CM

## 2019-03-28 DIAGNOSIS — T732XXA Exhaustion due to exposure, initial encounter: Secondary | ICD-10-CM

## 2019-03-28 DIAGNOSIS — J329 Chronic sinusitis, unspecified: Secondary | ICD-10-CM

## 2019-03-28 DIAGNOSIS — M797 Fibromyalgia: Secondary | ICD-10-CM

## 2019-03-28 DIAGNOSIS — Z1329 Encounter for screening for other suspected endocrine disorder: Secondary | ICD-10-CM | POA: Diagnosis not present

## 2019-03-28 NOTE — Progress Notes (Signed)
Subjective:     Heidi Gross is a 49 y.o. female who presents for evaluation of fatigue. Symptoms began a week ago. The patient feels the fatigue began with: headache, nasal drainage, post nasal drip.[name] Kitchen She has not tried any treatment for her sxs. She does have a h/o Fibromyalgia that has been well controlled. The pt reported rapid covid testing completed Friday last week that was negative. The pt denies sick exposure however she does work out at a [employer]. She denied CP/cT, GU/GI sxs, pain, sob, edema, or falls.  Review of Systems Pertinent items noted in HPI and remainder of comprehensive ROS otherwise negative.    Objective:  Physical Exam  Constitutional: She appears well-developed and well-nourished. No distress.  HENT: Normal Head: Normocephalic and atraumatic.  Right Ear: External ear normal.  Left Ear: External ear normal.  Nose: Erythema, mild edema to bilateral nares, +PNG  Mouth/Throat: Oropharynx is clear and moist. No oropharyngeal exudate.  Eyes: Pupils are equal, round, and reactive to light. Conjunctivae are normal. Right eye exhibits no discharge. Left eye exhibits no discharge.  Neck: Normal range of motion. Neck supple. No thyromegaly present.  Cardiovascular: Normal rate, regular rhythm and normal heart sounds. Exam reveals no gallop and no friction rub.  No murmur heard. Pulmonary/Chest: Effort normal No stridor. No respiratory distress. She has no wheezes. She exhibits no tenderness.  Abdominal: Soft. Nondistended  Musculoskeletal: Normal range of motion. She exhibits no edema or tenderness.  Lymphadenopathy: none   She has no cervical adenopathy.  Skin: No rash noted. She is not diaphoretic.  Vitals reviewed.    Assessment:   Your symptoms are consistent with rhinosinusitis. You may take Over the counter medications for your symptoms such as tylenol/ ibuprofen, Flonase 2 squirts each nostril once a day for 5 days, Claritin 10 mg daily.   Call the number  provided for a COVID repeat test if you still have symptoms on Monday.   Follow up or seek medical attention for non resolving or worsening symptoms.     Plan:   Follow up for non resolving or worsening or new symptoms. COVID test if sxs persist on Monday (PCR not rapid please)

## 2019-03-28 NOTE — Patient Instructions (Signed)
Call on Monday for a PCR COVID test if symptoms persist. [phone]

## 2019-04-16 ENCOUNTER — Other Ambulatory Visit: Payer: Self-pay | Admitting: Neurology

## 2019-04-16 MED ORDER — XYREM 500 MG/ML PO SOLN: ORAL | 5 refills | Status: DC

## 2019-05-08 ENCOUNTER — Ambulatory Visit: Admitting: Family Medicine

## 2019-05-10 ENCOUNTER — Ambulatory Visit: Admitting: Family Medicine

## 2019-05-21 ENCOUNTER — Other Ambulatory Visit

## 2019-05-21 ENCOUNTER — Other Ambulatory Visit: Payer: Self-pay | Admitting: Family Medicine

## 2019-05-21 ENCOUNTER — Other Ambulatory Visit: Payer: Self-pay

## 2019-05-21 DIAGNOSIS — Z1329 Encounter for screening for other suspected endocrine disorder: Secondary | ICD-10-CM

## 2019-05-21 DIAGNOSIS — Z0000 Encounter for general adult medical examination without abnormal findings: Secondary | ICD-10-CM

## 2019-05-21 LAB — COMPREHENSIVE METABOLIC PANEL
AG Ratio: 2.3 (calc) (ref 1.0–2.5)
ALT: 14 U/L (ref 6–29)
AST: 20 U/L (ref 10–35)
Albumin: 4.2 g/dL (ref 3.6–5.1)
Alkaline phosphatase (APISO): 32 U/L (ref 31–125)
BUN: 13 mg/dL (ref 7–25)
CO2: 30 mmol/L (ref 20–32)
Calcium: 9.6 mg/dL (ref 8.6–10.2)
Chloride: 103 mmol/L (ref 98–110)
Creat: 0.87 mg/dL (ref 0.50–1.10)
Globulin: 1.8 g/dL (calc) — ABNORMAL LOW (ref 1.9–3.7)
Glucose, Bld: 92 mg/dL (ref 65–99)
Potassium: 5.1 mmol/L (ref 3.5–5.3)
Sodium: 138 mmol/L (ref 135–146)
Total Bilirubin: 1 mg/dL (ref 0.2–1.2)
Total Protein: 6 g/dL — ABNORMAL LOW (ref 6.1–8.1)

## 2019-05-21 LAB — LIPID PANEL
Cholesterol: 169 mg/dL (ref <200)
HDL: 65 mg/dL (ref >=50)
LDL Cholesterol (Calc): 90 mg/dL (calc)
Non-HDL Cholesterol (Calc): 104 mg/dL (calc) (ref <130)
Total CHOL/HDL Ratio: 2.6 (calc) (ref <5.0)
Triglycerides: 55 mg/dL (ref <150)

## 2019-05-21 LAB — CBC WITH DIFFERENTIAL/PLATELET
Absolute Monocytes: 308 cells/uL (ref 200–950)
Basophils Absolute: 41 cells/uL (ref 0–200)
Basophils Relative: 1 %
Eosinophils Absolute: 41 cells/uL (ref 15–500)
Eosinophils Relative: 1 %
HCT: 43.2 % (ref 35.0–45.0)
Hemoglobin: 14 g/dL (ref 11.7–15.5)
Lymphs Abs: 1361 cells/uL (ref 850–3900)
MCH: 31.4 pg (ref 27.0–33.0)
MCHC: 32.4 g/dL (ref 32.0–36.0)
MCV: 96.9 fL (ref 80.0–100.0)
MPV: 11.8 fL (ref 7.5–12.5)
Monocytes Relative: 7.5 %
Neutro Abs: 2349 cells/uL (ref 1500–7800)
Neutrophils Relative %: 57.3 %
Platelets: 195 10*3/uL (ref 140–400)
RBC: 4.46 10*6/uL (ref 3.80–5.10)
RDW: 11.8 % (ref 11.0–15.0)
Total Lymphocyte: 33.2 %
WBC: 4.1 10*3/uL (ref 3.8–10.8)

## 2019-05-21 LAB — T4, FREE: Free T4: 1.2 ng/dL (ref 0.8–1.8)

## 2019-05-21 LAB — TSH: TSH: 1.3 mIU/L

## 2019-05-22 ENCOUNTER — Encounter: Payer: Self-pay | Admitting: Nurse Practitioner

## 2019-05-22 ENCOUNTER — Ambulatory Visit (INDEPENDENT_AMBULATORY_CARE_PROVIDER_SITE_OTHER): Admitting: Nurse Practitioner

## 2019-05-22 VITALS — BP 98/66 | HR 76 | Temp 98.2°F | Resp 18 | Ht 63.0 in | Wt 122.6 lb

## 2019-05-22 DIAGNOSIS — Z124 Encounter for screening for malignant neoplasm of cervix: Secondary | ICD-10-CM

## 2019-05-22 DIAGNOSIS — Z1231 Encounter for screening mammogram for malignant neoplasm of breast: Secondary | ICD-10-CM | POA: Diagnosis not present

## 2019-05-22 DIAGNOSIS — Z0000 Encounter for general adult medical examination without abnormal findings: Secondary | ICD-10-CM

## 2019-05-22 NOTE — Progress Notes (Signed)
Established Patient Office Visit  Subjective:  Patient ID: Heidi Gross, female    DOB: 04-Jun-1970  Age: 49 y.o. MRN: [id number]  CC:  Chief Complaint  Patient presents with  . Annual Exam    HPI Heidi Gross is a 49 year old caucasian female presenting for yearly well examination. She is due for PAP and Mammo. Time spent discussing lab results that are normal and healthy diet and exercise. The pt reported getting 5-6 days of exercise 30-45 minutes per week. She has breast implants appro 10 years. No concerns or sxs. No cp/ct, gu/gi sxs, pain, sob, edema, or recent falls.   Past Medical History:  Diagnosis Date  . Anxiety   . Blood transfusion without reported diagnosis   . Neuromuscular disorder [facility])     Past Surgical History:  Procedure Laterality Date  . AUGMENTATION MAMMAPLASTY    . CESAREAN SECTION      Family History  Problem Relation Age of Onset  . Heart disease Mother   . Alcohol abuse Father   . Diabetes Maternal Uncle   . Arthritis Maternal Grandmother   . Cancer Maternal Grandfather     Social History   Socioeconomic History  . Marital status: Married    Spouse name: Not on file  . Number of children: Not on file  . Years of education: Not on file  . Highest education level: Not on file  Occupational History  . Not on file  Tobacco Use  . Smoking status: Former Games developer  . Smokeless tobacco: Never Used  Substance and Sexual Activity  . Alcohol use: Yes    Alcohol/week: 2.0 standard drinks    Types: 2 Glasses of wine per week  . Drug use: No  . Sexual activity: Yes  Other Topics Concern  . Not on file  Social History Narrative  . Not on file   Social Determinants of Health   Financial Resource Strain:   . Difficulty of Paying Living Expenses:   Food Insecurity:   . Worried About Programme researcher, broadcasting/film/video in the Last Year:   . Barista in the Last Year:   Transportation Needs:   . Freight forwarder (Medical):   [name] Kitchen Lack of  Transportation (Non-Medical):   Physical Activity:   . Days of Exercise per Week:   . Minutes of Exercise per Session:   Stress:   . Feeling of Stress :   Social Connections:   . Frequency of Communication with Friends and Family:   . Frequency of Social Gatherings with Friends and Family:   . Attends Religious Services:   . Active Member of Clubs or Organizations:   . Attends Banker Meetings:   [name] Kitchen Marital Status:   Intimate Partner Violence:   . Fear of Current or Ex-Partner:   . Emotionally Abused:   [name] Kitchen Physically Abused:   . Sexually Abused:     Outpatient Medications Prior to Visit  Medication Sig Dispense Refill  . cephALEXin (KEFLEX) 250 MG capsule TAKE 1 CAPSULE DAILY, PROPHYLACTIC 90 capsule 3  . DULoxetine (CYMBALTA) 60 MG capsule TAKE 1 CAPSULE TWICE A DAY 180 capsule 3  . Sodium Oxybate (XYREM) 500 MG/ML SOLN Take 3.75 grams po diluted in 4ml of water at bedtime,repeat after minimum of 90 minutes and max 4 hours later. 270 mL 5   No facility-administered medications prior to visit.    Allergies  Allergen Reactions  . Sulfa Antibiotics     ROS Review  of Systems  Constitutional: Negative.   HENT: Negative.   Eyes: Negative.   Respiratory: Negative.   Cardiovascular: Negative.   Gastrointestinal: Negative.   Endocrine: Negative.   Genitourinary: Negative.   Musculoskeletal: Negative.   Skin: Negative.   Allergic/Immunologic: Negative.   Neurological: Negative.   Hematological: Negative.   Psychiatric/Behavioral: Negative.       Objective:    Physical Exam  Constitutional: She is oriented to person, place, and time. Vital signs are normal. She appears well-developed and well-nourished.  HENT:  Head: Normocephalic.  Right Ear: Hearing normal.  Left Ear: Hearing normal.  Nose: Nose normal.  Mouth/Throat: Oropharynx is clear and moist.  Eyes: Pupils are equal, round, and reactive to light. Conjunctivae, EOM and lids are normal. Lids are  everted and swept, no foreign bodies found.  Neck: No JVD present. Carotid bruit is not present. No thyromegaly present.  Cardiovascular: Normal rate, regular rhythm, S1 normal, S2 normal and normal heart sounds.  Pulmonary/Chest: Effort normal and breath sounds normal. No breast tenderness or discharge.  Abdominal: Soft. Bowel sounds are normal. She exhibits no distension and no abdominal bruit.  Genitourinary:    Vagina and uterus normal.     Pelvic exam was performed with patient prone.  There is no rash, tenderness, lesion or injury on the right labia. There is no rash, tenderness, lesion or injury on the left labia.    No vaginal discharge, erythema, tenderness or bleeding.  No erythema, tenderness or bleeding in the vagina.    Genitourinary Comments: CHAPERONE PRESENT: ANGELA   Musculoskeletal:        General: No edema. Normal range of motion.     Cervical back: Normal range of motion and neck supple.  Lymphadenopathy:    She has no cervical adenopathy.  NORMAL BREAST EXAMINATION , NO AXILLA ADENOPATHY, BREAST IMPLANTS PRESENT BILATERALLY  Neurological: She is alert and oriented to person, place, and time. She has normal strength and normal reflexes. No cranial nerve deficit. She displays a negative Romberg sign. Coordination normal.  Skin: Skin is warm, dry and intact.  Psychiatric: She has a normal mood and affect. Her speech is normal and behavior is normal. Judgment and thought content normal. Cognition and memory are normal.  Nursing note and vitals reviewed.   BP 98/66 (BP Location: Left Arm, Patient Position: Sitting, Cuff Size: Normal)   Pulse 76   Temp 98.2 F (36.8 C) (Temporal)   Resp 18   Ht 5\' 3"  (1.6 m)   Wt 122 lb 9.6 oz (55.6 kg)   LMP 05/09/2019   SpO2 98%   BMI 21.72 kg/m  Wt Readings from Last 3 Encounters:  05/22/19 122 lb 9.6 oz (55.6 kg)  03/28/19 123 lb 3.2 oz (55.9 kg)  05/01/18 122 lb (55.3 kg)     Health Maintenance Due  Topic Date Due  . PAP  SMEAR-Modifier  Never done    There are no preventive care reminders to display for this patient.  Lab Results  Component Value Date   TSH 1.30 05/21/2019   Lab Results  Component Value Date   WBC 4.1 05/21/2019   HGB 14.0 05/21/2019   HCT 43.2 05/21/2019   MCV 96.9 05/21/2019   PLT 195 05/21/2019   Lab Results  Component Value Date   NA 138 05/21/2019   K 5.1 05/21/2019   CO2 30 05/21/2019   GLUCOSE 92 05/21/2019   BUN 13 05/21/2019   CREATININE 0.87 05/21/2019   BILITOT 1.0 05/21/2019  ALKPHOS 44 12/26/2017   AST 20 05/21/2019   ALT 14 05/21/2019   PROT 6.0 (L) 05/21/2019   ALBUMIN 4.6 12/26/2017   CALCIUM 9.6 05/21/2019   Lab Results  Component Value Date   CHOL 169 05/21/2019   Lab Results  Component Value Date   HDL 65 05/21/2019   Lab Results  Component Value Date   LDLCALC 90 05/21/2019   Lab Results  Component Value Date   TRIG 55 05/21/2019   Lab Results  Component Value Date   CHOLHDL 2.6 05/21/2019   No results found for: HGBA1C    Assessment & Plan:  GENERAL EXAMINATION WELL VISIT: COMPLETED LAB REVIEW WITH PT NORMAL, COMPLETED PAP, HAD FLU AND TDAP WAS 1 YEAR AGO, NEG SCREENING, BLOOD PRESSURE NORMAL, EDUCATION ON HELATHY DIET AND EXERCISE  CONTINUE EXERCISE 4-5 TIMES PER WEEK  PERFORM SELF BREAST EXAMINATION MONTHLY, COMPLETE MAMMOGRAM Problem List Items Addressed This Visit    None    Visit Diagnoses    Encounter for screening mammogram for malignant neoplasm of breast    -  Primary   Relevant Orders   MM Digital Screening   Cervical cancer screening       Relevant Orders   PAP, Thin Prep w/HPV rflx HPV Type 16/18      Follow-up: Return in about 1 year (around 05/21/2020).    Elmore Guise, FNP

## 2019-05-24 LAB — PAP, TP IMAGING W/ HPV RNA, RFLX HPV TYPE 16,18/45: HPV DNA High Risk: NOT DETECTED

## 2019-05-27 ENCOUNTER — Other Ambulatory Visit: Payer: Self-pay | Admitting: Family Medicine

## 2019-05-27 DIAGNOSIS — Z1231 Encounter for screening mammogram for malignant neoplasm of breast: Secondary | ICD-10-CM

## 2019-05-28 NOTE — Progress Notes (Signed)
Negative PAP for intraepithelial lesion or malignancy

## 2019-06-25 ENCOUNTER — Other Ambulatory Visit: Payer: Self-pay

## 2019-06-25 ENCOUNTER — Encounter: Payer: Self-pay | Admitting: Adult Health

## 2019-06-25 ENCOUNTER — Ambulatory Visit (INDEPENDENT_AMBULATORY_CARE_PROVIDER_SITE_OTHER): Admitting: Adult Health

## 2019-06-25 VITALS — BP 111/69 | HR 69 | Temp 97.1°F | Ht 63.0 in | Wt 122.0 lb

## 2019-06-25 DIAGNOSIS — G47419 Narcolepsy without cataplexy: Secondary | ICD-10-CM

## 2019-06-25 NOTE — Progress Notes (Signed)
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  She returns today for follow-up.  Reports that Xyrem continues to work well for her.  Reports that she is able to work and drive without falling asleep.  Denies depression.  Denies any thoughts of harming herself or others.  Recently had blood work with her primary care which was relatively unremarkable.  She returns today for an evaluation.  HISTORY December 25, 2018: Heidi Gross is a 49 year old female with a history of narcolepsy.  She returns today for follow-up.  She continues on Xyrem and is tolerating it well.  She denies falling asleep while driving or at work.  Denies any cataplectic events.  She had blood work in March with her primary care and sodium level was in normal range.  Overall she feels that she is doing well.  She returns today for evaluation.   REVIEW OF SYSTEMS: Out of a complete 14 system review of symptoms, the patient complains only of the following symptoms, and all other reviewed systems are negative.  ALLERGIES: Allergies  Allergen Reactions  . Sulfa Antibiotics     HOME MEDICATIONS: Outpatient Medications Prior to Visit  Medication Sig Dispense Refill  . cephALEXin (KEFLEX) 250 MG capsule TAKE 1 CAPSULE DAILY, PROPHYLACTIC 90 capsule 3  . DULoxetine (CYMBALTA) 60 MG capsule TAKE 1 CAPSULE TWICE A DAY 180 capsule 3  . Sodium Oxybate (XYREM) 500 MG/ML SOLN Take 3.75 grams po diluted in 54ml of water at bedtime,repeat after minimum of 90 minutes and max 4 hours later. 270 mL 5   No facility-administered medications prior to visit.    PAST MEDICAL HISTORY: Past Medical History:  Diagnosis Date  . Anxiety   . Blood transfusion without reported diagnosis   . Neuromuscular disorder (HCC)     PAST SURGICAL HISTORY: Past Surgical  History:  Procedure Laterality Date  . AUGMENTATION MAMMAPLASTY    . CESAREAN SECTION      FAMILY HISTORY: Family History  Problem Relation Age of Onset  . Heart disease Mother   . Alcohol abuse Father   . Diabetes Maternal Uncle   . Arthritis Maternal Grandmother   . Cancer Maternal Grandfather     SOCIAL HISTORY: Social History   Socioeconomic History  . Marital status: Married    Spouse name: Not on file  . Number of children: Not on file  . Years of education: Not on file  . Highest education level: Not on file  Occupational History  . Not on file  Tobacco Use  . Smoking status: Former Games developer  . Smokeless tobacco: Never Used  Substance and Sexual Activity  . Alcohol use: Yes    Alcohol/week: 2.0 standard drinks    Types: 2 Glasses of wine per week  . Drug use: No  . Sexual activity: Yes  Other Topics Concern  . Not on file  Social History Narrative  . Not on file   Social Determinants of Health   Financial Resource Strain:   . Difficulty of Paying Living Expenses:   Food Insecurity:   . Worried About Programme researcher, broadcasting/film/video in the Last Year:   . Barista in the Last Year:   Transportation Needs:   . Freight forwarder (Medical):   [name] Kitchen Lack of Transportation (Non-Medical):   Physical Activity:   .  Days of Exercise per Week:   . Minutes of Exercise per Session:   Stress:   . Feeling of Stress :   Social Connections:   . Frequency of Communication with Friends and Family:   . Frequency of Social Gatherings with Friends and Family:   . Attends Religious Services:   . Active Member of Clubs or Organizations:   . Attends Banker Meetings:   [name] Kitchen Marital Status:   Intimate Partner Violence:   . Fear of Current or Ex-Partner:   . Emotionally Abused:   [name] Kitchen Physically Abused:   . Sexually Abused:       PHYSICAL EXAM  Vitals:   06/25/19 1125  BP: 111/69  Pulse: 69  Temp: (!) 97.1 F (36.2 C)  Weight: 122 lb (55.3 kg)  Height: 5'  3" (1.6 m)   Body mass index is 21.61 kg/m.  Generalized: Well developed, in no acute distress   Neurological examination  Mentation: Alert oriented to time, place, history taking. Follows all commands speech and language fluent Cranial nerve II-XII: Pupils were equal round reactive to light. Extraocular movements were full, visual field were full on confrontational test.  Head turning and shoulder shrug  were normal and symmetric. Motor: The motor testing reveals 5 over 5 strength of all 4 extremities. Good symmetric motor tone is noted throughout.  Sensory: Sensory testing is intact to soft touch on all 4 extremities. No evidence of extinction is noted.  Coordination: Cerebellar testing reveals good finger-nose-finger and heel-to-shin bilaterally.  Gait and station: Gait is normal.  Reflexes: Deep tendon reflexes are symmetric and normal bilaterally.   DIAGNOSTIC DATA (LABS, IMAGING, TESTING) - I reviewed patient records, labs, notes, testing and imaging myself where available.  Lab Results  Component Value Date   WBC 4.1 05/21/2019   HGB 14.0 05/21/2019   HCT 43.2 05/21/2019   MCV 96.9 05/21/2019   PLT 195 05/21/2019      Component Value Date/Time   NA 138 05/21/2019 1022   NA 141 12/26/2017 1430   K 5.1 05/21/2019 1022   CL 103 05/21/2019 1022   CO2 30 05/21/2019 1022   GLUCOSE 92 05/21/2019 1022   BUN 13 05/21/2019 1022   BUN 9 12/26/2017 1430   CREATININE 0.87 05/21/2019 1022   CALCIUM 9.6 05/21/2019 1022   PROT 6.0 (L) 05/21/2019 1022   PROT 6.5 12/26/2017 1430   ALBUMIN 4.6 12/26/2017 1430   AST 20 05/21/2019 1022   ALT 14 05/21/2019 1022   ALKPHOS 44 12/26/2017 1430   BILITOT 1.0 05/21/2019 1022   BILITOT 0.5 12/26/2017 1430   GFRNONAA 81 12/26/2017 1430   GFRAA 93 12/26/2017 1430   Lab Results  Component Value Date   CHOL 169 05/21/2019   HDL 65 05/21/2019   LDLCALC 90 05/21/2019   TRIG 55 05/21/2019   CHOLHDL 2.6 05/21/2019    Lab Results    Component Value Date   TSH 1.30 05/21/2019      ASSESSMENT AND PLAN 49 y.o. year old female  has a past medical history of Anxiety, Blood transfusion without reported diagnosis, and Neuromuscular disorder (HCC). here with:  1.  Narcolepsy  -Continue Xyrem -Advised if symptoms worsen or she develops new symptoms she should let us know -Follow-up in 6 months or sooner if needed  I spent 20 minutes of face-to-face and non-face-to-face time with patient.  This included previsit chart review, lab review, study review, order entry, electronic health record documentation, patient education.  Ward Givens, MSN, NP-C 06/25/2019, 11:30 AM [facility] [address], [place], [place] [address] [phone]

## 2019-06-25 NOTE — Patient Instructions (Signed)
Your Plan:  Continue Xyrem If your symptoms worsen or you develop new symptoms please let us know.   Thank you for coming to see [place] at [facility]. I hope we have been able to provide you high quality care today.  You may receive a patient satisfaction survey over the next few weeks. We would appreciate your feedback and comments so that we may continue to improve ourselves and the health of our patients.

## 2019-07-08 ENCOUNTER — Ambulatory Visit

## 2019-07-12 ENCOUNTER — Other Ambulatory Visit: Payer: Self-pay

## 2019-07-12 ENCOUNTER — Ambulatory Visit
Admission: RE | Admit: 2019-07-12 | Discharge: 2019-07-12 | Disposition: A | Discharge status: Home / Self Care | Source: Ambulatory Visit | Attending: Family Medicine | Admitting: Family Medicine

## 2019-07-12 DIAGNOSIS — Z1231 Encounter for screening mammogram for malignant neoplasm of breast: Secondary | ICD-10-CM

## 2019-07-29 ENCOUNTER — Encounter: Payer: Self-pay | Admitting: Family Medicine

## 2019-07-29 ENCOUNTER — Other Ambulatory Visit: Payer: Self-pay

## 2019-07-29 ENCOUNTER — Ambulatory Visit (INDEPENDENT_AMBULATORY_CARE_PROVIDER_SITE_OTHER): Admitting: Family Medicine

## 2019-07-29 ENCOUNTER — Ambulatory Visit

## 2019-07-29 DIAGNOSIS — M79604 Pain in right leg: Secondary | ICD-10-CM | POA: Diagnosis not present

## 2019-07-29 DIAGNOSIS — M79605 Pain in left leg: Secondary | ICD-10-CM

## 2019-07-29 MED ORDER — MELOXICAM 15 MG PO TABS: 7.5000 mg | ORAL_TABLET | Freq: Every day | ORAL | 6 refills | Status: DC | PRN

## 2019-07-29 NOTE — Progress Notes (Signed)
Patient states she has hip pain, but is pointing to her buttocks/sciatica area, stating that pain goes down both legs at times. She states no known injury. She works out 6 days a week.

## 2019-07-29 NOTE — Progress Notes (Signed)
Office Visit Note   Patient: Heidi Gross           Date of Birth: 1970/12/19           MRN: [id number] Visit Date: 07/29/2019 Requested by: Salley Scarlet, MD [address] [place],  [place] [address] PCP: Salley Scarlet, MD  Subjective: Chief Complaint  Patient presents with  . Left Leg - Pain  . Right Leg - Pain    HPI: She is here with left greater than right posterior hip and leg pain.  Symptoms started about 2 months ago, no injury.  She works out quite a bit and exercises seem to aggravate her pain sometimes, but she does not think there was ever a moment where she hurt herself working out.  Pain travels down the back of the leg toward the foot sometimes.  No weakness or numbness, no bowel or bladder dysfunction.  She does not take medication for her pain.  Recently she started going to a chiropractor but so far it has not made much difference, she plans to start going more regularly soon.  She had an issue with left-sided sciatica about a year ago but did not seek treatment for it, it resolved on its own.  Denies any anterior hip pain.              ROS:   All other systems were reviewed and are negative.  Objective: Vital Signs: There were no vitals taken for this visit.  Physical Exam:  General:  Alert and oriented, in no acute distress. Pulm:  Breathing unlabored. Psy:  Normal mood, congruent affect.  Low back: She has no scoliosis, equal leg lengths.  Lower extremity strength is normal throughout.  She has 2+ knee DTRs, hypoactive Achilles DTRs bilaterally.  Good range of motion of both hips with no pain.  She is mildly tender in the sciatic notch on both sides.  No tenderness over the lumbar spine or SI joints, negative stork test bilaterally.  Imaging: XR Lumbar Spine 2-3 Views  Result Date: 07/29/2019 X-rays lumbar spine reveal very early narrowing of the L4-5 disc space and moderate lower lumbar facet degenerative change.  No sign of compression fracture  or neoplasm.   Assessment & Plan: 1.  Left more than right posterior hip and leg pain, suspicious for lumbar disc protrusion. -Trial of meloxicam along with chiropractic.  McKenzie protocol at home. -MRI scan if symptoms persist.      Procedures: No procedures performed  No notes on file     PMFS History: Patient Active Problem List   Diagnosis Date Noted  . Family history of narcolepsy 12/26/2017  . Excessive daytime sleepiness 12/26/2017  . Encounter for medication monitoring 12/26/2017  . Chronic neck pain 12/29/2016  . Fibromyalgia 11/07/2016  . Narcolepsy 11/07/2016  . History of recurrent UTIs 11/07/2016   Past Medical History:  Diagnosis Date  . Anxiety   . Blood transfusion without reported diagnosis   . Neuromuscular disorder (HCC)     Family History  Problem Relation Age of Onset  . Heart disease Mother   . Alcohol abuse Father   . Diabetes Maternal Uncle   . Arthritis Maternal Grandmother   . Cancer Maternal Grandfather     Past Surgical History:  Procedure Laterality Date  . AUGMENTATION MAMMAPLASTY    . CESAREAN SECTION     Social History   Occupational History  . Not on file  Tobacco Use  .  Smoking status: Former Research scientist (life sciences)  . Smokeless tobacco: Never Used  Vaping Use  . Vaping Use: Never used  Substance and Sexual Activity  . Alcohol use: Yes    Alcohol/week: 2.0 standard drinks    Types: 2 Glasses of wine per week  . Drug use: No  . Sexual activity: Yes

## 2019-08-13 NOTE — Addendum Note (Signed)
Addended by: Lillia Carmel on: 08/13/2019 10:19 AM   Modules accepted: Orders

## 2019-09-03 ENCOUNTER — Other Ambulatory Visit: Payer: Self-pay

## 2019-09-03 ENCOUNTER — Ambulatory Visit
Admission: RE | Admit: 2019-09-03 | Discharge: 2019-09-03 | Disposition: A | Discharge status: Home / Self Care | Source: Ambulatory Visit | Attending: Family Medicine | Admitting: Family Medicine

## 2019-09-03 DIAGNOSIS — M79605 Pain in left leg: Secondary | ICD-10-CM

## 2019-09-04 ENCOUNTER — Telehealth: Payer: Self-pay | Admitting: Family Medicine

## 2019-09-04 DIAGNOSIS — M79604 Pain in right leg: Secondary | ICD-10-CM

## 2019-09-04 NOTE — Telephone Encounter (Signed)
Lumbar MRI scan shows a left-sided disc protrusion at L4-5 which is near the L4 nerve root.  This is probably the source of pain.

## 2019-09-04 NOTE — Addendum Note (Signed)
Addended by: Lillia Carmel on: 09/04/2019 10:46 AM   Modules accepted: Orders

## 2019-09-07 ENCOUNTER — Other Ambulatory Visit

## 2019-09-24 ENCOUNTER — Other Ambulatory Visit: Payer: Self-pay | Admitting: Neurology

## 2019-09-24 MED ORDER — XYREM 500 MG/ML PO SOLN: ORAL | 5 refills | Status: DC

## 2019-10-10 ENCOUNTER — Encounter: Payer: Self-pay | Admitting: Family Medicine

## 2019-11-11 ENCOUNTER — Encounter: Payer: Self-pay | Admitting: Family Medicine

## 2019-11-11 DIAGNOSIS — M79605 Pain in left leg: Secondary | ICD-10-CM

## 2019-11-11 DIAGNOSIS — M79604 Pain in right leg: Secondary | ICD-10-CM

## 2019-11-14 ENCOUNTER — Telehealth: Payer: Self-pay

## 2019-11-14 NOTE — Telephone Encounter (Signed)
Patient called she is requesting a appointment with Dr.Newton for epidural injection she stated she wont be available today until after 4:00. Callback:[phone]

## 2019-11-15 ENCOUNTER — Other Ambulatory Visit: Payer: Self-pay | Admitting: Physical Medicine and Rehabilitation

## 2019-11-15 DIAGNOSIS — F411 Generalized anxiety disorder: Secondary | ICD-10-CM

## 2019-11-15 MED ORDER — DIAZEPAM 5 MG PO TABS: ORAL_TABLET | ORAL | 0 refills | Status: DC

## 2019-11-15 NOTE — Progress Notes (Signed)
Pre-procedure diazepam ordered for pre-operative anxiety.  

## 2019-11-15 NOTE — Telephone Encounter (Signed)
Called pt and sch. 

## 2019-11-15 NOTE — Telephone Encounter (Signed)
Called pt and she ask me to call her back at 10:30 today.

## 2019-12-04 ENCOUNTER — Other Ambulatory Visit: Payer: Self-pay | Admitting: Physical Medicine and Rehabilitation

## 2019-12-04 MED ORDER — DIAZEPAM 5 MG PO TABS: ORAL_TABLET | ORAL | 0 refills | Status: DC

## 2019-12-04 NOTE — Progress Notes (Signed)
Pre-procedure diazepam ordered for pre-operative anxiety.  

## 2019-12-05 ENCOUNTER — Other Ambulatory Visit: Payer: Self-pay

## 2019-12-05 ENCOUNTER — Ambulatory Visit: Payer: Self-pay

## 2019-12-05 ENCOUNTER — Encounter: Payer: Self-pay | Admitting: Physical Medicine and Rehabilitation

## 2019-12-05 ENCOUNTER — Ambulatory Visit (INDEPENDENT_AMBULATORY_CARE_PROVIDER_SITE_OTHER): Admitting: Physical Medicine and Rehabilitation

## 2019-12-05 VITALS — BP 113/76 | HR 81

## 2019-12-05 DIAGNOSIS — M5116 Intervertebral disc disorders with radiculopathy, lumbar region: Secondary | ICD-10-CM | POA: Diagnosis not present

## 2019-12-05 DIAGNOSIS — M5416 Radiculopathy, lumbar region: Secondary | ICD-10-CM

## 2019-12-05 MED ORDER — BETAMETHASONE SOD PHOS & ACET 6 (3-3) MG/ML IJ SUSP
12.0000 mg | Freq: Once | INTRAMUSCULAR | Status: AC
  Administered 2019-12-05: 12 mg

## 2019-12-05 NOTE — Progress Notes (Signed)
Pt state lower back that travels down both legs. Pt state cleaning house and standing makes the pain worse. Pt state she takes pain meds, use heating and ice packs to helps ease the pain. Pt state she excise to help with pain.  Numeric Pain Rating Scale and Functional Assessment Average Pain 6   In the last MONTH (on 0-10 scale) has pain interfered with the following?  1. General activity like being  able to carry out your everyday physical activities such as walking, climbing stairs, carrying groceries, or moving a chair?  Rating(7)   +Driver, -BT, -Dye Allergies.

## 2019-12-05 NOTE — Progress Notes (Signed)
Heidi Gross - 49 y.o. female MRN [id number]  Date of birth: [birth year]  Office Visit Note: Visit Date: 12/05/2019 PCP: Salley Scarlet, MD Referred by: Salley Scarlet, MD  Subjective: Chief Complaint  Patient presents with  . Lower Back - Pain  . Left Leg - Pain  . Right Leg - Pain   HPI:  Heidi Gross is a 49 y.o. female who comes in today at the request of Dr. Lavada Mesi for planned Left L4-L5 Lumbar epidural steroid injection with fluoroscopic guidance.  The patient has failed conservative care including home exercise, medications, time and activity modification.  This injection will be diagnostic and hopefully therapeutic.  Please see requesting physician notes for further details and justification.   ROS Otherwise per HPI.  Assessment & Plan: Visit Diagnoses:  1. Radiculopathy due to lumbar intervertebral disc disorder   2. Lumbar radiculopathy     Plan: No additional findings.   Meds & Orders:  Meds ordered this encounter  Medications  . betamethasone acetate-betamethasone sodium phosphate (CELESTONE) injection 12 mg    Orders Placed This Encounter  Procedures  . XR C-ARM NO REPORT  . Epidural Steroid injection    Follow-up: Return if symptoms worsen or fail to improve.   Procedures: No procedures performed  Lumbar Epidural Steroid Injection - Interlaminar Approach with Fluoroscopic Guidance  Patient: Heidi Gross      Date of Birth: 21-Jan-1971 MRN: [id number] PCP: Salley Scarlet, MD      Visit Date: 12/05/2019   Universal Protocol:     Consent Given By: the patient  Position: PRONE  Additional Comments: Vital signs were monitored before and after the procedure. Patient was prepped and draped in the usual sterile fashion. The correct patient, procedure, and site was verified.   Injection Procedure Details:   Procedure diagnoses:  1. Radiculopathy due to lumbar intervertebral disc disorder      Meds Administered:  Meds  ordered this encounter  Medications  . betamethasone acetate-betamethasone sodium phosphate (CELESTONE) injection 12 mg     Laterality: Left  Location/Site:  L4-L5  Needle:3.5 in., 20 ga. Tuohy  Needle Placement: Paramedian epidural  Findings:   -Comments: Excellent flow of contrast into the epidural space.  Procedure Details: Using a paramedian approach from the side mentioned above, the region overlying the inferior lamina was localized under fluoroscopic visualization and the soft tissues overlying this structure were infiltrated with 4 ml. of 1% Lidocaine without Epinephrine. The Tuohy needle was inserted into the epidural space using a paramedian approach.   The epidural space was localized using loss of resistance along with counter oblique bi-planar fluoroscopic views.  After negative aspirate for air, blood, and CSF, a 2 ml. volume of Isovue-250 was injected into the epidural space and the flow of contrast was observed. Radiographs were obtained for documentation purposes.    The injectate was administered into the level noted above.   Additional Comments:  The patient tolerated the procedure well Dressing: 2 x 2 sterile gauze and Band-Aid    Post-procedure details: Patient was observed during the procedure. Post-procedure instructions were reviewed.  Patient left the clinic in stable condition.    Clinical History: MRI LUMBAR SPINE WITHOUT CONTRAST  TECHNIQUE: Multiplanar, multisequence MR imaging of the lumbar spine was performed. No intravenous contrast was administered.  COMPARISON:  Prior radiograph from 07/29/2019.  FINDINGS: Segmentation: Standard. Lowest well-formed disc space labeled the L5-S1 level.  Alignment: Physiologic with preservation of the normal lumbar lordosis.  No listhesis or subluxation.  Vertebrae: Vertebral body height maintained without evidence for acute or chronic fracture. Bone marrow signal intensity within normal  limits. Few scattered benign hemangiomata noted. No worrisome osseous lesions. No abnormal marrow edema.  Conus medullaris and cauda equina: Conus extends to the L1 level. Conus and cauda equina appear normal.  Paraspinal and other soft tissues: Paraspinous soft tissues within normal limits. Visualized visceral structures are normal.  Disc levels:  L1-2: Mild disc bulge with disc desiccation. No significant canal or foraminal stenosis.  L2-3: Disc desiccation without significant disc bulge. No canal or foraminal stenosis.  L3-4: Minor disc desiccation without significant disc bulge. No canal or foraminal stenosis.  L4-5: Mild diffuse disc bulge with disc desiccation. Superimposed broad-based left foraminal to extraforaminal disc protrusion with associated annular fissure (series 6, image 25). Protruding disc closely approximates the exiting left L4 nerve root without frank neural impingement. Mild bilateral facet hypertrophy. No significant spinal stenosis. Mild left L4 foraminal narrowing. No significant right foraminal encroachment.  L5-S1: Disc desiccation without significant disc bulge. Minimal facet hypertrophy. No canal or foraminal stenosis. No impingement.  IMPRESSION: 1. Broad-based left foraminal to extraforaminal disc protrusion at L4-5, closely approximating and potentially irritating the exiting left L4 nerve root. 2. Additional mild noncompressive disc bulging elsewhere within the lumbar spine as above without significant stenosis or neural impingement.   Electronically Signed   By: Rise Mu M.D.   On: 09/04/2019 00:14     Objective:  VS:  HT:    WT:   BMI:     BP:113/76  HR:81bpm  TEMP: ( )  RESP:  Physical Exam   Imaging: No results found.

## 2019-12-22 NOTE — Procedures (Signed)
Lumbar Epidural Steroid Injection - Interlaminar Approach with Fluoroscopic Guidance  Patient: Heidi Gross      Date of Birth: March 06, 1970 MRN: [id number] PCP: Salley Scarlet, MD      Visit Date: 12/05/2019   Universal Protocol:     Consent Given By: the patient  Position: PRONE  Additional Comments: Vital signs were monitored before and after the procedure. Patient was prepped and draped in the usual sterile fashion. The correct patient, procedure, and site was verified.   Injection Procedure Details:   Procedure diagnoses:  1. Radiculopathy due to lumbar intervertebral disc disorder      Meds Administered:  Meds ordered this encounter  Medications  . betamethasone acetate-betamethasone sodium phosphate (CELESTONE) injection 12 mg     Laterality: Left  Location/Site:  L4-L5  Needle:3.5 in., 20 ga. Tuohy  Needle Placement: Paramedian epidural  Findings:   -Comments: Excellent flow of contrast into the epidural space.  Procedure Details: Using a paramedian approach from the side mentioned above, the region overlying the inferior lamina was localized under fluoroscopic visualization and the soft tissues overlying this structure were infiltrated with 4 ml. of 1% Lidocaine without Epinephrine. The Tuohy needle was inserted into the epidural space using a paramedian approach.   The epidural space was localized using loss of resistance along with counter oblique bi-planar fluoroscopic views.  After negative aspirate for air, blood, and CSF, a 2 ml. volume of Isovue-250 was injected into the epidural space and the flow of contrast was observed. Radiographs were obtained for documentation purposes.    The injectate was administered into the level noted above.   Additional Comments:  The patient tolerated the procedure well Dressing: 2 x 2 sterile gauze and Band-Aid    Post-procedure details: Patient was observed during the procedure. Post-procedure  instructions were reviewed.  Patient left the clinic in stable condition.

## 2019-12-26 ENCOUNTER — Other Ambulatory Visit: Payer: Self-pay | Admitting: Family Medicine

## 2019-12-26 DIAGNOSIS — M797 Fibromyalgia: Secondary | ICD-10-CM

## 2019-12-27 ENCOUNTER — Encounter: Payer: Self-pay | Admitting: Family Medicine

## 2019-12-27 DIAGNOSIS — M79604 Pain in right leg: Secondary | ICD-10-CM

## 2019-12-27 NOTE — Addendum Note (Signed)
Addended by: Lillia Carmel on: 12/27/2019 12:10 PM   Modules accepted: Orders

## 2019-12-30 NOTE — Progress Notes (Deleted)
PATIENT: Heidi Gross DOB: 1970-02-20  REASON FOR VISIT: follow up HISTORY FROM: patient  Virtual Visit via Video Note  I connected with Heidi Gross on 12/30/19 at  1:30 PM EST by a video enabled telemedicine application located remotely at [facility] and verified that I am speaking with the correct person using two identifiers who was located at their own home.   I discussed the limitations of evaluation and management by telemedicine and the availability of in person appointments. The patient expressed understanding and agreed to proceed.   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  She returns today for follow-up.  Reports that Xyrem continues to work well for her.  Reports that she is able to work and drive without falling asleep.  Denies depression.  Denies any thoughts of harming herself or others.  Recently had blood work with her primary care which was relatively unremarkable.  She returns today for an evaluation.  REVIEW OF SYSTEMS: Out of a complete 14 system review of symptoms, the patient complains only of the following symptoms, and all other reviewed systems are negative.  ALLERGIES: Allergies  Allergen Reactions  . Sulfa Antibiotics     HOME MEDICATIONS: Outpatient Medications Prior to Visit  Medication Sig Dispense Refill  . cephALEXin (KEFLEX) 250 MG capsule TAKE 1 CAPSULE DAILY, PROPHYLACTIC 90 capsule 3  . diazepam (VALIUM) 5 MG tablet Take 1 by mouth 1 hour  pre-procedure with very light food. May bring 2nd tablet to appointment. 2 tablet 0  . DULoxetine (CYMBALTA) 60 MG capsule TAKE 1 CAPSULE TWICE A DAY 180 capsule 3  . meloxicam (MOBIC) 15 MG tablet Take 0.5-1 tablets (7.5-15 mg total) by mouth daily as needed for pain.  30 tablet 6  . Sodium Oxybate (XYREM) 500 MG/ML SOLN Take 3.75 grams po diluted in 63ml of water at bedtime,repeat after minimum of 90 minutes and max 4 hours later. 270 mL 5   Facility-Administered Medications Prior to Visit  Medication Dose Route Frequency Provider Last Rate Last Admin  . betamethasone acetate-betamethasone sodium phosphate (CELESTONE) injection 12 mg  12 mg Other Once Tyrell Antonio, MD        PAST MEDICAL HISTORY: Past Medical History:  Diagnosis Date  . Anxiety   . Blood transfusion without reported diagnosis   . Neuromuscular disorder (HCC)     PAST SURGICAL HISTORY: Past Surgical History:  Procedure Laterality Date  . AUGMENTATION MAMMAPLASTY    . CESAREAN SECTION      FAMILY HISTORY: Family History  Problem Relation Age of Onset  . Heart disease Mother   . Alcohol abuse Father   . Diabetes Maternal Uncle   . Arthritis Maternal Grandmother   . Cancer Maternal Grandfather     SOCIAL HISTORY: Social History   Socioeconomic History  . Marital status: Married    Spouse name: Not on file  . Number of children: Not on file  . Years of education: Not on file  . Highest education level: Not on file  Occupational History  . Not on file  Tobacco Use  . Smoking status: Former Games developer  . Smokeless tobacco: Never Used  Vaping Use  . Vaping Use: Never used  Substance and Sexual Activity  . Alcohol use: Yes    Alcohol/week: 2.0  standard drinks    Types: 2 Glasses of wine per week  . Drug use: No  . Sexual activity: Yes  Other Topics Concern  . Not on file  Social History Narrative  . Not on file   Social Determinants of Health   Financial Resource Strain:   . Difficulty of Paying Living Expenses: Not on file  Food Insecurity:   . Worried About Programme researcher, broadcasting/film/video in the Last Year: Not on file  . Ran Out of Food in the Last Year: Not on file  Transportation Needs:   . Lack of Transportation (Medical): Not on file  . Lack of Transportation  (Non-Medical): Not on file  Physical Activity:   . Days of Exercise per Week: Not on file  . Minutes of Exercise per Session: Not on file  Stress:   . Feeling of Stress : Not on file  Social Connections:   . Frequency of Communication with Friends and Family: Not on file  . Frequency of Social Gatherings with Friends and Family: Not on file  . Attends Religious Services: Not on file  . Active Member of Clubs or Organizations: Not on file  . Attends Banker Meetings: Not on file  . Marital Status: Not on file  Intimate Partner Violence:   . Fear of Current or Ex-Partner: Not on file  . Emotionally Abused: Not on file  . Physically Abused: Not on file  . Sexually Abused: Not on file      PHYSICAL EXAM Generalized: Well developed, in no acute distress   Neurological examination  Mentation: Alert oriented to time, place, history taking. Follows all commands speech and language fluent Cranial nerve II-XII:Extraocular movements were full. Facial symmetry noted. uvula tongue midline. Head turning and shoulder shrug  were normal and symmetric. Motor: Good strength throughout subjectively per patient Sensory: Sensory testing is intact to soft touch on all 4 extremities subjectively per patient Coordination: Cerebellar testing reveals good finger-nose-finger  Gait and station: Patient is able to stand from a seated position. gait is normal.  Reflexes: UTA  DIAGNOSTIC DATA (LABS, IMAGING, TESTING) - I reviewed patient records, labs, notes, testing and imaging myself where available.  Lab Results  Component Value Date   WBC 4.1 05/21/2019   HGB 14.0 05/21/2019   HCT 43.2 05/21/2019   MCV 96.9 05/21/2019   PLT 195 05/21/2019      Component Value Date/Time   NA 138 05/21/2019 1022   NA 141 12/26/2017 1430   K 5.1 05/21/2019 1022   CL 103 05/21/2019 1022   CO2 30 05/21/2019 1022   GLUCOSE 92 05/21/2019 1022   BUN 13 05/21/2019 1022   BUN 9 12/26/2017 1430    CREATININE 0.87 05/21/2019 1022   CALCIUM 9.6 05/21/2019 1022   PROT 6.0 (L) 05/21/2019 1022   PROT 6.5 12/26/2017 1430   ALBUMIN 4.6 12/26/2017 1430   AST 20 05/21/2019 1022   ALT 14 05/21/2019 1022   ALKPHOS 44 12/26/2017 1430   BILITOT 1.0 05/21/2019 1022   BILITOT 0.5 12/26/2017 1430   GFRNONAA 81 12/26/2017 1430   GFRAA 93 12/26/2017 1430   Lab Results  Component Value Date   CHOL 169 05/21/2019   HDL 65 05/21/2019   LDLCALC 90 05/21/2019   TRIG 55 05/21/2019   CHOLHDL 2.6 05/21/2019   No results found for: HGBA1C No results found for: VITAMINB12 Lab Results  Component Value Date   TSH 1.30 05/21/2019      ASSESSMENT  AND PLAN 49 y.o. year old female  has a past medical history of Anxiety, Blood transfusion without reported diagnosis, and Neuromuscular disorder (HCC). here with ***   I spent 15 minutes with the patient. 50% of this time was spent   Butch Penny, MSN, NP-C 12/30/2019, 5:20 PM [facility] [address], [address] [place], [place] [address] [phone]

## 2019-12-31 ENCOUNTER — Telehealth: Payer: Self-pay | Admitting: Physical Medicine and Rehabilitation

## 2019-12-31 ENCOUNTER — Telehealth (INDEPENDENT_AMBULATORY_CARE_PROVIDER_SITE_OTHER): Admitting: Adult Health

## 2019-12-31 DIAGNOSIS — Z5181 Encounter for therapeutic drug level monitoring: Secondary | ICD-10-CM | POA: Diagnosis not present

## 2019-12-31 DIAGNOSIS — G47419 Narcolepsy without cataplexy: Secondary | ICD-10-CM

## 2019-12-31 NOTE — Progress Notes (Signed)
  [facility] [address] [place]. [place] [address] [phone]     Virtual Visit via Telephone Note  I connected with Heidi Gross on 12/31/19 at  1:30 PM EST by telephone located remotely at [facility] and verified that I am speaking with the correct person using two identifiers who reports being located at home   Visit scheduled by [facility] phone staff. She discussed the limitations, risks, security and privacy concerns of performing an evaluation and management service by telephone and the availability of in person appointments. I also discussed with the patient that there may be a patient responsible charge related to this service. The patient expressed understanding and agreed to proceed. See telephone note for consent and additional scheduling information.    History of Present Illness:  Heidi Gross is a 49 y.o. female who has been followed in this office for narcolepsy was originally scheduled for a MyChart video visit however we were unable to connect through video.  Her appointment was transitioned to a telephone visit.  Overall she feels that her narcolepsy has been well treated with Xyrem.  She denies falling asleep while driving or at work.  Denies any thoughts of harming herself or others.  Denies depression.  Overall she feels that she is doing well.  She returns today for an evaluation.  06/25/19:  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  She returns today for follow-up.  Reports that Xyrem continues to work well for her.  Reports that she is able to work and drive without falling asleep.  Denies depression.  Denies any thoughts of harming herself or others.  Recently had blood work with her primary care which was relatively unremarkable.  She returns today for an evaluation.   Observations/Objective:  Generalized: Well developed, in no acute distress   Neurological examination    Mentation: Alert oriented to time, place, history taking. Follows all commands speech and language fluent  Assessment and Plan:  1: Primary narcolepsy  -Continue Xyrem -Blood work in April was unremarkable -Advised if symptoms worsen or she develops new symptoms she should let us know    Follow Up Instructions:   F/U in 6 months or sooner if needed    I discussed the assessment and treatment plan with the patient.  The patient was provided an opportunity to ask questions and all were answered to their satisfaction. The patient agreed with the plan and verbalized an understanding of the instructions.   I provided 10 minutes of non-face-to-face time during this encounter.    Butch Penny NP-C  [facility] [address] [place], [place] [address]  Phone [phone] Fax [phone]

## 2019-12-31 NOTE — Telephone Encounter (Signed)
Patient called. She would like Heidi Gross to call her back at [phone]

## 2019-12-31 NOTE — Telephone Encounter (Signed)
Called pt back and sch 12/2

## 2020-01-12 ENCOUNTER — Encounter: Payer: Self-pay | Admitting: Physical Medicine and Rehabilitation

## 2020-01-16 ENCOUNTER — Ambulatory Visit: Payer: Self-pay

## 2020-01-16 ENCOUNTER — Encounter: Payer: Self-pay | Admitting: Physical Medicine and Rehabilitation

## 2020-01-16 ENCOUNTER — Ambulatory Visit (INDEPENDENT_AMBULATORY_CARE_PROVIDER_SITE_OTHER): Admitting: Physical Medicine and Rehabilitation

## 2020-01-16 ENCOUNTER — Other Ambulatory Visit: Payer: Self-pay

## 2020-01-16 VITALS — BP 113/82 | HR 80

## 2020-01-16 DIAGNOSIS — M5416 Radiculopathy, lumbar region: Secondary | ICD-10-CM

## 2020-01-16 DIAGNOSIS — M5116 Intervertebral disc disorders with radiculopathy, lumbar region: Secondary | ICD-10-CM | POA: Diagnosis not present

## 2020-01-16 MED ORDER — BETAMETHASONE SOD PHOS & ACET 6 (3-3) MG/ML IJ SUSP
12.0000 mg | Freq: Once | INTRAMUSCULAR | Status: AC
  Administered 2020-01-16: 12 mg

## 2020-01-16 NOTE — Progress Notes (Signed)
Pt state lower back pain that travel down both legs mostly her right leg. Pt state she excise a lot but took a week and half off and notice it hurt worse when she stopped. Pt state walking and standing makes the pain worse. Pt state she takes pain meds to help ease the pain. Pt has hx of inj on 12/05/19 pt state she got about three days of relief but the pain was never really gone.  Numeric Pain Rating Scale and Functional Assessment Average Pain 4   In the last MONTH (on 0-10 scale) has pain interfered with the following?  1. General activity like being  able to carry out your everyday physical activities such as walking, climbing stairs, carrying groceries, or moving a chair?  Rating(8)   +Driver, -BT, -Dye Allergies.

## 2020-01-28 ENCOUNTER — Telehealth: Payer: Self-pay | Admitting: Neurology

## 2020-01-28 NOTE — Telephone Encounter (Signed)
Received a PA request for the patient. Attempted to complete on cover my meds/express scripts. Once submitted the message I got was "Clinical Override not needed" The current PA is approved until 02/27/2020. It may be too soon to complete. Will try again after 1st of the year.  LFY:[id number]

## 2020-02-04 NOTE — Telephone Encounter (Signed)
Called [employer] to get new insurance information for the patient. Her member ID is [id number].  Called [employer] to initiate PA for the patient. Called the number provided and they advised the same information that a PA was on file for the patient and was good until 02/27/2020. Advised that [facility] pharmacy was contacting our office advising that a PA was needed.   Completed the PA over the phone and received approval from 01/05/2020 until 02/03/2021. ID# [id number]

## 2020-02-04 NOTE — Telephone Encounter (Signed)
Vince @ [facility] pharmacy called to report pt has [employer].  [facility] no longer accepts PA's thru Cover My Meds they can either be called at [phone] or go on web site  [email or web address]

## 2020-02-26 ENCOUNTER — Other Ambulatory Visit: Payer: Self-pay

## 2020-02-26 MED ORDER — XYREM 500 MG/ML PO SOLN: ORAL | 5 refills | Status: DC

## 2020-02-26 NOTE — Telephone Encounter (Signed)
Dr. Vickey Huger signed Xyrem refill request.  Faxed to [facility] pharmacy.  Received a receipt of confirmation.

## 2020-03-23 NOTE — Progress Notes (Signed)
Heidi Gross - 50 y.o. female MRN [id number]  Date of birth: 1970/06/11  Office Visit Note: Visit Date: 01/16/2020 PCP: Salley Scarlet, MD Referred by: Salley Scarlet, MD  Subjective: Chief Complaint  Patient presents with  . Lower Back - Pain  . Left Leg - Pain  . Right Leg - Pain   HPI:  Heidi Gross is a 50 y.o. female who comes in today for planned repeat Left L4-L5 Lumbar epidural steroid injection with fluoroscopic guidance.  The patient has failed conservative care including home exercise, medications, time and activity modification.  This injection will be diagnostic and hopefully therapeutic.  Please see requesting physician notes for further details and justification. Patient received more than 50% pain relief from prior injection.   Referring: Dr. Casimiro Needle Hilts   ROS Otherwise per HPI.  Assessment & Plan: Visit Diagnoses:    ICD-10-CM   1. Lumbar radiculopathy  M54.16 XR C-ARM NO REPORT    Epidural Steroid injection    betamethasone acetate-betamethasone sodium phosphate (CELESTONE) injection 12 mg  2. Radiculopathy due to lumbar intervertebral disc disorder  M51.16 XR C-ARM NO REPORT    Epidural Steroid injection    betamethasone acetate-betamethasone sodium phosphate (CELESTONE) injection 12 mg    Plan: No additional findings.   Meds & Orders:  Meds ordered this encounter  Medications  . betamethasone acetate-betamethasone sodium phosphate (CELESTONE) injection 12 mg    Orders Placed This Encounter  Procedures  . XR C-ARM NO REPORT  . Epidural Steroid injection    Follow-up: Return if symptoms worsen or fail to improve.   Procedures: No procedures performed  Lumbar Epidural Steroid Injection - Interlaminar Approach with Fluoroscopic Guidance  Patient: Heidi Gross      Date of Birth: Dec 11, 1970 MRN: [id number] PCP: Salley Scarlet, MD      Visit Date: 01/16/2020   Universal Protocol:     Consent Given By: the  patient  Position: PRONE  Additional Comments: Vital signs were monitored before and after the procedure. Patient was prepped and draped in the usual sterile fashion. The correct patient, procedure, and site was verified.   Injection Procedure Details:   Procedure diagnoses: Lumbar radiculopathy [M54.16]   Meds Administered:  Meds ordered this encounter  Medications  . betamethasone acetate-betamethasone sodium phosphate (CELESTONE) injection 12 mg     Laterality: Left  Location/Site:  L4-L5  Needle: 3.5 in., 20 ga. Tuohy  Needle Placement: Paramedian epidural  Findings:   -Comments: Excellent flow of contrast into the epidural space.  Procedure Details: Using a paramedian approach from the side mentioned above, the region overlying the inferior lamina was localized under fluoroscopic visualization and the soft tissues overlying this structure were infiltrated with 4 ml. of 1% Lidocaine without Epinephrine. The Tuohy needle was inserted into the epidural space using a paramedian approach.   The epidural space was localized using loss of resistance along with counter oblique bi-planar fluoroscopic views.  After negative aspirate for air, blood, and CSF, a 2 ml. volume of Isovue-250 was injected into the epidural space and the flow of contrast was observed. Radiographs were obtained for documentation purposes.    The injectate was administered into the level noted above.   Additional Comments:  The patient tolerated the procedure well Dressing: 2 x 2 sterile gauze and Band-Aid    Post-procedure details: Patient was observed during the procedure. Post-procedure instructions were reviewed.  Patient left the clinic in stable condition.     Clinical  History: MRI LUMBAR SPINE WITHOUT CONTRAST  TECHNIQUE: Multiplanar, multisequence MR imaging of the lumbar spine was performed. No intravenous contrast was administered.  COMPARISON:  Prior radiograph from  07/29/2019.  FINDINGS: Segmentation: Standard. Lowest well-formed disc space labeled the L5-S1 level.  Alignment: Physiologic with preservation of the normal lumbar lordosis. No listhesis or subluxation.  Vertebrae: Vertebral body height maintained without evidence for acute or chronic fracture. Bone marrow signal intensity within normal limits. Few scattered benign hemangiomata noted. No worrisome osseous lesions. No abnormal marrow edema.  Conus medullaris and cauda equina: Conus extends to the L1 level. Conus and cauda equina appear normal.  Paraspinal and other soft tissues: Paraspinous soft tissues within normal limits. Visualized visceral structures are normal.  Disc levels:  L1-2: Mild disc bulge with disc desiccation. No significant canal or foraminal stenosis.  L2-3: Disc desiccation without significant disc bulge. No canal or foraminal stenosis.  L3-4: Minor disc desiccation without significant disc bulge. No canal or foraminal stenosis.  L4-5: Mild diffuse disc bulge with disc desiccation. Superimposed broad-based left foraminal to extraforaminal disc protrusion with associated annular fissure (series 6, image 25). Protruding disc closely approximates the exiting left L4 nerve root without frank neural impingement. Mild bilateral facet hypertrophy. No significant spinal stenosis. Mild left L4 foraminal narrowing. No significant right foraminal encroachment.  L5-S1: Disc desiccation without significant disc bulge. Minimal facet hypertrophy. No canal or foraminal stenosis. No impingement.  IMPRESSION: 1. Broad-based left foraminal to extraforaminal disc protrusion at L4-5, closely approximating and potentially irritating the exiting left L4 nerve root. 2. Additional mild noncompressive disc bulging elsewhere within the lumbar spine as above without significant stenosis or neural impingement.   Electronically Signed   By: Rise Mu  M.D.   On: 09/04/2019 00:14     Objective:  VS:  HT:    WT:   BMI:     BP:113/82  HR:80bpm  TEMP: ( )  RESP:  Physical Exam Vitals and nursing note reviewed.  Constitutional:      General: She is not in acute distress.    Appearance: Normal appearance. She is not ill-appearing.  HENT:     Head: Normocephalic and atraumatic.     Right Ear: External ear normal.     Left Ear: External ear normal.  Eyes:     Extraocular Movements: Extraocular movements intact.  Cardiovascular:     Rate and Rhythm: Normal rate.     Pulses: Normal pulses.  Pulmonary:     Effort: Pulmonary effort is normal. No respiratory distress.  Abdominal:     General: There is no distension.     Palpations: Abdomen is soft.  Musculoskeletal:        General: Tenderness present.     Cervical back: Neck supple.     Right lower leg: No edema.     Left lower leg: No edema.     Comments: Patient has good distal strength with no pain over the greater trochanters.  No clonus or focal weakness.  Skin:    Findings: No erythema, lesion or rash.  Neurological:     General: No focal deficit present.     Mental Status: She is alert and oriented to person, place, and time.     Sensory: No sensory deficit.     Motor: No weakness or abnormal muscle tone.     Coordination: Coordination normal.  Psychiatric:        Mood and Affect: Mood normal.  Behavior: Behavior normal.      Imaging: No results found.

## 2020-03-23 NOTE — Procedures (Signed)
Lumbar Epidural Steroid Injection - Interlaminar Approach with Fluoroscopic Guidance  Patient: Heidi Gross      Date of Birth: 1970-12-14 MRN: [id number] PCP: Salley Scarlet, MD      Visit Date: 01/16/2020   Universal Protocol:     Consent Given By: the patient  Position: PRONE  Additional Comments: Vital signs were monitored before and after the procedure. Patient was prepped and draped in the usual sterile fashion. The correct patient, procedure, and site was verified.   Injection Procedure Details:   Procedure diagnoses: Lumbar radiculopathy [M54.16]   Meds Administered:  Meds ordered this encounter  Medications  . betamethasone acetate-betamethasone sodium phosphate (CELESTONE) injection 12 mg     Laterality: Left  Location/Site:  L4-L5  Needle: 3.5 in., 20 ga. Tuohy  Needle Placement: Paramedian epidural  Findings:   -Comments: Excellent flow of contrast into the epidural space.  Procedure Details: Using a paramedian approach from the side mentioned above, the region overlying the inferior lamina was localized under fluoroscopic visualization and the soft tissues overlying this structure were infiltrated with 4 ml. of 1% Lidocaine without Epinephrine. The Tuohy needle was inserted into the epidural space using a paramedian approach.   The epidural space was localized using loss of resistance along with counter oblique bi-planar fluoroscopic views.  After negative aspirate for air, blood, and CSF, a 2 ml. volume of Isovue-250 was injected into the epidural space and the flow of contrast was observed. Radiographs were obtained for documentation purposes.    The injectate was administered into the level noted above.   Additional Comments:  The patient tolerated the procedure well Dressing: 2 x 2 sterile gauze and Band-Aid    Post-procedure details: Patient was observed during the procedure. Post-procedure instructions were reviewed.  Patient left the  clinic in stable condition.

## 2020-04-16 ENCOUNTER — Telehealth: Payer: Self-pay | Admitting: Adult Health

## 2020-04-16 NOTE — Telephone Encounter (Signed)
Rescheduled 05/17 myChart visit to 06/23/20 due to NP being out of office.

## 2020-05-25 ENCOUNTER — Encounter: Admitting: Family Medicine

## 2020-06-23 ENCOUNTER — Telehealth: Admitting: Adult Health

## 2020-06-30 ENCOUNTER — Telehealth: Admitting: Adult Health

## 2020-07-23 ENCOUNTER — Other Ambulatory Visit: Payer: Self-pay | Admitting: Neurological Surgery

## 2020-07-23 DIAGNOSIS — M5126 Other intervertebral disc displacement, lumbar region: Secondary | ICD-10-CM

## 2020-08-04 ENCOUNTER — Other Ambulatory Visit: Payer: Self-pay | Admitting: Neurology

## 2020-08-04 MED ORDER — XYREM 500 MG/ML PO SOLN: ORAL | 1 refills | Status: DC

## 2020-08-13 ENCOUNTER — Telehealth: Payer: Self-pay | Admitting: Adult Health

## 2020-08-13 NOTE — Telephone Encounter (Signed)
Pharmacist Katie @ [facility] states a new prescription form is needed.  Heidi Gross is asking that whoever signs needs their information at the top of the form in the prescriber section the fax# to use is [phone]

## 2020-08-13 NOTE — Telephone Encounter (Signed)
Script has been corrected and sent to the pharmacy

## 2020-08-17 NOTE — Progress Notes (Signed)
Cardiology Office Note   Date:  08/18/2020   ID:  Heidi Gross, DOB [birth year], MRN [id number]  PCP:  Benita Stabile, MD    No chief complaint on file.  palpitations  Wt Readings from Last 3 Encounters:  08/18/20 115 lb (52.2 kg)  06/25/19 122 lb (55.3 kg)  05/22/19 122 lb 9.6 oz (55.6 kg)       History of Present Illness: Heidi Gross is a 50 y.o. female who is being seen today for the evaluation of palpitations at the request of Benita Stabile, MD.   Palpitations have ben going on for months and getting more frequent.  Sx last for a few seconds at a time.  Episodes occur several times a week, multiple times a day.  Two cups of coffee/daily.  No relation between increased caffeine and palpitations.  She has some sx with anxiety.    No prior heart tests.    No problems during exercise.  Did Boot camp until back injury 7 months ago.  Now yoga and spin class since then.  Denies : Chest pain. Dizziness. Leg edema. Nitroglycerin use. Orthopnea. Paroxysmal nocturnal dyspnea. Shortness of breath. Syncope.    Mother with MI at 2 and arrythmia.   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 270 mL 1   No current facility-administered medications for this visit.    Allergies:   Sulfa antibiotics    Social  History:  The patient  reports that she has quit smoking. She has never used smokeless tobacco. She reports current alcohol use of about 2.0 standard drinks of alcohol per week. She reports that she does not use drugs.   Family History:  The patient's family history includes Alcohol abuse in her father; Arthritis in her maternal grandmother; Cancer in her maternal grandfather; Diabetes in her maternal uncle; Heart disease in her mother.    ROS:  Please see the history of present illness.   Otherwise, review of systems are positive for palpitations, anxiety.   All other systems are reviewed and negative.    PHYSICAL EXAM: VS:  BP 118/70   Pulse 72   Ht 5\' 3"  (1.6 m)   Wt 115 lb (52.2 kg)   SpO2 99%   BMI 20.37 kg/m  , BMI Body mass index is 20.37 kg/m. GEN: Well nourished, well developed, in no acute distress HEENT: normal Neck: no JVD, carotid bruits, or masses Cardiac: RRR; no murmurs, rubs, or gallops,no edema  Respiratory:  clear to auscultation bilaterally, normal work of breathing GI: soft, nontender, nondistended, + BS MS: no deformity or atrophy Skin: warm and dry, no rash Neuro:  Strength and sensation are intact Psych: euthymic mood, full affect   EKG:   The ekg ordered  today demonstrates NSR, no ST changes, rSR' pattern   Recent Labs: No results found for requested labs within last 8760 hours.   Lipid Panel    Component Value Date/Time   CHOL 169 05/21/2019 1022   TRIG 55 05/21/2019 1022   HDL 65 05/21/2019 1022   CHOLHDL 2.6 05/21/2019 1022   LDLCALC 90 05/21/2019 1022     Other studies Reviewed: Additional studies/ records that were reviewed today with results demonstrating: Labs reviewed.  EKG from primary care office reviewed showing sinus rhythm.  There was a report of sinus arrhythmia but I reassured patient that this is a sign of a healthy heart.   ASSESSMENT AND PLAN:  Palpitations: Symptoms sound like premature beats, either PACs or PVCs.  No  lightheadedness or syncope associated.  This does not sound like a life-threatening arrhythmia.  Cardiac exam is normal without any evidence of heart failure.  She has an apple watch and she is able to snap ECGs [place] when she feels palpitations.  We discussed the monitor, but she will use her apple watch and send [place] any abnormal tracings.  She has tried this in the past and everything has come back normal.  Continue to monitor.  Check TSH as well since it has been over a year. Hyperkalemia: This was noted at her primary care doctor.  It was supposed to be rechecked but due to changing doctors, it has not been rechecked. We discussed her heart rate target.  Although her heart rate gets to 170-180 at peak exercise, it decreases quickly.  Would not put any restrictions on her exercise.   Current medicines are reviewed at length with the patient today.  The patient concerns regarding her medicines were addressed.  The following changes have been made:  No change  Labs/ tests ordered today include:  No orders of the defined types were placed in this encounter.   Recommend 150 minutes/week of aerobic exercise Low fat, low carb, high fiber diet recommended  Disposition:   FU as needed   Signed, Lance Muss, MD  08/18/2020 9:45 AM    [facility] [address] [place], [place], [place]  [address] Phone: [phone]; Fax: [phone]

## 2020-08-18 ENCOUNTER — Other Ambulatory Visit: Payer: Self-pay

## 2020-08-18 ENCOUNTER — Ambulatory Visit: Admitting: Interventional Cardiology

## 2020-08-18 VITALS — BP 118/70 | HR 72 | Ht 63.0 in | Wt 115.0 lb

## 2020-08-18 DIAGNOSIS — E875 Hyperkalemia: Secondary | ICD-10-CM

## 2020-08-18 DIAGNOSIS — R002 Palpitations: Secondary | ICD-10-CM

## 2020-08-18 LAB — BASIC METABOLIC PANEL
BUN/Creatinine Ratio: 11 (ref 9–23)
BUN: 11 mg/dL (ref 6–24)
CO2: 29 mmol/L (ref 20–29)
Calcium: 10.2 mg/dL (ref 8.7–10.2)
Chloride: 101 mmol/L (ref 96–106)
Creatinine, Ser: 1.03 mg/dL — ABNORMAL HIGH (ref 0.57–1.00)
Glucose: 95 mg/dL (ref 65–99)
Potassium: 4.7 mmol/L (ref 3.5–5.2)
Sodium: 142 mmol/L (ref 134–144)
eGFR: 66 mL/min/{1.73_m2} (ref >59)

## 2020-08-18 LAB — TSH: TSH: 1.29 u[IU]/mL (ref 0.450–4.500)

## 2020-08-18 NOTE — Patient Instructions (Signed)
Medication Instructions:  Your physician recommends that you continue on your current medications as directed. Please refer to the Current Medication list given to you today.  *If you need a refill on your cardiac medications before your next appointment, please call your pharmacy*   Lab Work: Lab work to be done today--BMP and TSH If you have labs (blood work) drawn today and your tests are completely normal, you will receive your results only by: MyChart Message (if you have MyChart) OR A paper copy in the mail If you have any lab test that is abnormal or we need to change your treatment, we will call you to review the results.   Testing/Procedures: none   Follow-Up: At [facility], you and your health needs are our priority.  As part of our continuing mission to provide you with exceptional heart care, we have created designated Provider Care Teams.  These Care Teams include your primary Cardiologist (physician) and Advanced Practice Providers (APPs -  Physician Assistants and Nurse Practitioners) who all work together to provide you with the care you need, when you need it.  We recommend signing up for the patient portal called "MyChart".  Sign up information is provided on this After Visit Summary.  MyChart is used to connect with patients for Virtual Visits (Telemedicine).  Patients are able to view lab/test results, encounter notes, upcoming appointments, etc.  Non-urgent messages can be sent to your provider as well.   To learn more about what you can do with MyChart, go to [email or web address].    Your next appointment:   Follow up as needed  The format for your next appointment:   In Person  Provider:   You may see Lance Muss, MD or one of the following Advanced Practice Providers on your designated Care Team:   Ronie Spies, PA-C Jacolyn Reedy, PA-C   Other Instructions

## 2020-08-26 ENCOUNTER — Ambulatory Visit
Admission: RE | Admit: 2020-08-26 | Discharge: 2020-08-26 | Disposition: A | Discharge status: Home / Self Care | Source: Ambulatory Visit | Attending: Neurological Surgery | Admitting: Neurological Surgery

## 2020-08-26 DIAGNOSIS — M5126 Other intervertebral disc displacement, lumbar region: Secondary | ICD-10-CM

## 2020-09-22 ENCOUNTER — Other Ambulatory Visit: Payer: Self-pay | Admitting: Neurology

## 2020-09-22 MED ORDER — XYREM 500 MG/ML PO SOLN: ORAL | 5 refills | Status: DC

## 2020-09-29 ENCOUNTER — Telehealth (INDEPENDENT_AMBULATORY_CARE_PROVIDER_SITE_OTHER): Admitting: Adult Health

## 2020-09-29 DIAGNOSIS — G47419 Narcolepsy without cataplexy: Secondary | ICD-10-CM | POA: Diagnosis not present

## 2020-09-29 NOTE — Progress Notes (Deleted)
PATIENT: Heidi Gross DOB: 1970/05/17  REASON FOR VISIT: follow up HISTORY FROM: patient  HISTORY OF PRESENT ILLNESS: Today 09/29/20:  Heidi Gross is a 50 year old female with a history of narcolepsy without cataplexy.  She returns today for follow-up.  Reports that Xyrem continues to work well for her.  Reports that she is able to work and drive without falling asleep.  Denies depression.  Denies any thoughts of harming herself or others.  Recently had blood work with her primary care which was relatively unremarkable.  She returns today for an evaluation.  HISTORY December 25, 2018: Heidi Gross is a 50 year old female with a history of narcolepsy.  She returns today for follow-up.  She continues on Xyrem and is tolerating it well.  She denies falling asleep while driving or at work.  Denies any cataplectic events.  She had blood work in March with her primary care and sodium level was in normal range.  Overall she feels that she is doing well.  She returns today for evaluation.    REVIEW OF SYSTEMS: Out of a complete 14 system review of symptoms, the patient complains only of the following symptoms, and all other reviewed systems are negative.  ALLERGIES: Allergies  Allergen Reactions   Sulfa Antibiotics     HOME MEDICATIONS: Outpatient Medications Prior to Visit  Medication Sig Dispense Refill   cephALEXin (KEFLEX) 250 MG capsule TAKE 1 CAPSULE DAILY, PROPHYLACTIC 90 capsule 3   DULoxetine (CYMBALTA) 60 MG capsule TAKE 1 CAPSULE TWICE A DAY 180 capsule 3   Sodium Oxybate (XYREM) 500 MG/ML SOLN Take 3.75 grams twice nightly. 270 mL 5   No facility-administered medications prior to visit.    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    Other intervertebral disc  displacement, lumbar region    Palpitations     PAST SURGICAL HISTORY: Past Surgical History:  Procedure Laterality Date   AUGMENTATION MAMMAPLASTY     CESAREAN SECTION      FAMILY HISTORY: Family History  Problem Relation Age of Onset   Heart disease Mother    Alcohol abuse Father    Diabetes Maternal Uncle    Arthritis Maternal Grandmother    Cancer Maternal Grandfather     SOCIAL HISTORY: Social History   Socioeconomic History   Marital status: Married    Spouse name: Not on file   Number of children: Not on file   Years of education: Not on file   Highest education level: Not on file  Occupational History   Not on file  Tobacco Use   Smoking status: Former   Smokeless tobacco: Never  Vaping Use   Vaping Use: Never used  Substance and Sexual Activity   Alcohol use: Yes    Alcohol/week: 2.0 standard drinks    Types: 2 Glasses of wine per week   Drug use: No   Sexual activity: Yes  Other Topics Concern   Not on file  Social History Narrative   Not on file   Social Determinants of Health   Financial Resource Strain: Not on file  Food Insecurity: Not on file  Transportation Needs: Not on file  Physical Activity: Not on file  Stress: Not on file  Social Connections: Not on file  Intimate Partner Violence:  Not on file      PHYSICAL EXAM  There were no vitals filed for this visit.  There is no height or weight on file to calculate BMI.  Generalized: Well developed, in no acute distress   Neurological examination  Mentation: Alert oriented to time, place, history taking. Follows all commands speech and language fluent Cranial nerve II-XII: Pupils were equal round reactive to light. Extraocular movements were full, visual field were full on confrontational test.  Head turning and shoulder shrug  were normal and symmetric. Motor: The motor testing reveals 5 over 5 strength of all 4 extremities. Good symmetric motor tone is noted throughout.  Sensory:  Sensory testing is intact to soft touch on all 4 extremities. No evidence of extinction is noted.  Coordination: Cerebellar testing reveals good finger-nose-finger and heel-to-shin bilaterally.  Gait and station: Gait is normal.  Reflexes: Deep tendon reflexes are symmetric and normal bilaterally.   DIAGNOSTIC DATA (LABS, IMAGING, TESTING) - I reviewed patient records, labs, notes, testing and imaging myself where available.  Lab Results  Component Value Date   WBC 4.1 05/21/2019   HGB 14.0 05/21/2019   HCT 43.2 05/21/2019   MCV 96.9 05/21/2019   PLT 195 05/21/2019      Component Value Date/Time   NA 142 08/18/2020 1018   K 4.7 08/18/2020 1018   CL 101 08/18/2020 1018   CO2 29 08/18/2020 1018   GLUCOSE 95 08/18/2020 1018   GLUCOSE 92 05/21/2019 1022   BUN 11 08/18/2020 1018   CREATININE 1.03 (H) 08/18/2020 1018   CREATININE 0.87 05/21/2019 1022   CALCIUM 10.2 08/18/2020 1018   PROT 6.0 (L) 05/21/2019 1022   PROT 6.5 12/26/2017 1430   ALBUMIN 4.6 12/26/2017 1430   AST 20 05/21/2019 1022   ALT 14 05/21/2019 1022   ALKPHOS 44 12/26/2017 1430   BILITOT 1.0 05/21/2019 1022   BILITOT 0.5 12/26/2017 1430   GFRNONAA 81 12/26/2017 1430   GFRAA 93 12/26/2017 1430   Lab Results  Component Value Date   CHOL 169 05/21/2019   HDL 65 05/21/2019   LDLCALC 90 05/21/2019   TRIG 55 05/21/2019   CHOLHDL 2.6 05/21/2019    Lab Results  Component Value Date   TSH 1.290 08/18/2020      ASSESSMENT AND PLAN 50 y.o. 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 here with:  1.  Narcolepsy  -Continue Xyrem -Advised if symptoms worsen or she develops new symptoms she should let us know -Follow-up in 6 months or sooner if needed  I spent 20  minutes of face-to-face and non-face-to-face time with patient.  This included previsit chart review, lab review, study review, order entry, electronic health record documentation, patient education.  Butch Penny, MSN, NP-C 09/29/2020, 2:06 PM [facility] [address], [address] [place], [place] [address] [phone]

## 2020-09-29 NOTE — Progress Notes (Signed)
  [facility] [address] [place] [address] [phone]  PRIMARY NEUROLOGIST: Dr. Vickey Huger   Virtual Visit via Telephone Note  I connected with Heidi Gross on 09/29/20 at  2:30 PM EDT by telephone located remotely at [facility] and verified that I am speaking with the correct person using two identifiers who reports being located RN   Visit scheduled by Me.. She discussed the limitations, risks, security and privacy concerns of performing an evaluation and management service by telephone and the availability of in person appointments. I also discussed with the patient that there may be a patient responsible charge related to this service. The patient expressed understanding and agreed to proceed. See telephone note for consent and additional scheduling information.    History of Present Illness:  Heidi Gross is a 50 y.o. female who has been followed in this office for closely.  She was scheduled for a MyChart visit but she was unable to get her camera to work so we transition to a telephone visit.  Patient reports that Xyrem continues to work well for her.  She denies falling asleep while driving or at work.  Denies any cataplectic events.  Recently had blood work in July that was unremarkable.  Is in the process of getting a new primary care provider.  She returns today for an evaluation..      Observations/Objective:  Generalized: Well developed, in no acute distress   Neurological examination  Mentation: Alert oriented to time, place, history taking. Follows all commands speech and language fluent  Assessment and Plan:  1: Narcolepsy  -Continue Xyrem -Reviewed blood work by PCP   Follow Up Instructions:   F/U in 6 months or sooner if needed    I discussed the assessment and treatment plan with the patient.  The patient was provided an opportunity to ask questions and all were answered to their satisfaction. The  patient agreed with the plan and verbalized an understanding of the instructions.   I provided 10 minutes of non-face-to-face time during this encounter.    Butch Penny NP-C  [facility] [address] [place], [place] [address]  Phone [phone] Fax [phone] \

## 2020-10-26 DIAGNOSIS — M5126 Other intervertebral disc displacement, lumbar region: Secondary | ICD-10-CM | POA: Insufficient documentation

## 2021-01-04 ENCOUNTER — Telehealth: Payer: Self-pay | Admitting: Neurology

## 2021-01-04 NOTE — Telephone Encounter (Signed)
PA submitted through [facility] KEY: [id number] Waiting for determination

## 2021-01-04 NOTE — Telephone Encounter (Signed)
PA approved for the pt  CaseId:[id number];Status:Approved;Review Type:Prior Auth;Coverage Start Date:12/05/2020;Coverage End Date:01/04/2022;

## 2021-02-23 ENCOUNTER — Other Ambulatory Visit: Payer: Self-pay | Admitting: Neurology

## 2021-02-23 MED ORDER — XYREM 500 MG/ML PO SOLN: ORAL | 1 refills | Status: DC

## 2021-03-29 ENCOUNTER — Other Ambulatory Visit: Payer: Self-pay | Admitting: Physician Assistant

## 2021-03-29 DIAGNOSIS — Z1231 Encounter for screening mammogram for malignant neoplasm of breast: Secondary | ICD-10-CM

## 2021-04-13 ENCOUNTER — Telehealth (INDEPENDENT_AMBULATORY_CARE_PROVIDER_SITE_OTHER): Admitting: Adult Health

## 2021-04-13 DIAGNOSIS — G47419 Narcolepsy without cataplexy: Secondary | ICD-10-CM | POA: Diagnosis not present

## 2021-04-13 NOTE — Progress Notes (Signed)
PATIENT: Heidi Gross DOB: 05-Jul-1970  REASON FOR VISIT: follow up HISTORY FROM: patient  Virtual Visit via Video Note  I connected with Heidi Gross on 04/13/21 at 11:30 AM EST by a video enabled telemedicine application located remotely at [facility] and verified that I am speaking with the correct person using two identifiers who was located at their own home.   I discussed the limitations of evaluation and management by telemedicine and the availability of in person appointments. The patient expressed understanding and agreed to proceed.   PATIENT: Heidi Gross DOB: 1970-04-18  REASON FOR VISIT: follow up HISTORY FROM: patient  HISTORY OF PRESENT ILLNESS: Today 04/13/21:  Heidi Gross is a 51 year old female with a history of narcolepsy.  She returns today for follow-up.  She is currently on Xyrem.  Reports that it is working well.  Denies any difficulty staying awake while driving or working.  Denies any cataplectic events.  She is saying that [facility] is telling her to switch Xyrem to generic form?   REVIEW OF SYSTEMS: Out of a complete 14 system review of symptoms, the patient complains only of the following symptoms, and all other reviewed systems are negative.  ALLERGIES: Allergies  Allergen Reactions   Sulfa Antibiotics     HOME MEDICATIONS: Outpatient Medications Prior to Visit  Medication Sig Dispense Refill   cephALEXin (KEFLEX) 250 MG capsule TAKE 1 CAPSULE DAILY, PROPHYLACTIC 90 capsule 3   DULoxetine (CYMBALTA) 60 MG capsule TAKE 1 CAPSULE TWICE A DAY 180 capsule 3   Sodium Oxybate (XYREM) 500 MG/ML SOLN Take 3.75 grams twice nightly. 270 mL 1   No facility-administered medications prior to visit.    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    Other intervertebral disc displacement, lumbar region    Palpitations     PAST SURGICAL HISTORY: Past Surgical History:  Procedure Laterality Date   AUGMENTATION MAMMAPLASTY     CESAREAN SECTION      FAMILY HISTORY: Family History  Problem Relation Age of Onset   Heart disease Mother    Alcohol abuse Father    Diabetes Maternal Uncle    Arthritis Maternal Grandmother    Cancer Maternal Grandfather     SOCIAL HISTORY: Social History   Socioeconomic History   Marital status: Married    Spouse name: Not on file   Number of children: Not on file   Years of education: Not on file   Highest education level: Not on file  Occupational History   Not on file  Tobacco Use   Smoking status: Former   Smokeless tobacco: Never  Vaping Use   Vaping Use: Never used  Substance and Sexual Activity   Alcohol use: Yes    Alcohol/week: 2.0 standard drinks    Types: 2 Glasses of wine per week   Drug use: No   Sexual activity: Yes  Other Topics Concern   Not on file  Social History Narrative   Not on file   Social Determinants of Health   Financial Resource Strain: Not on file  Food Insecurity: Not on file  Transportation Needs: Not on file  Physical Activity: Not on file  Stress: Not on file  Social Connections: Not on file  Intimate  Partner Violence: Not on file      PHYSICAL EXAM Generalized: Well developed, in no acute distress   Neurological examination  Mentation: Alert oriented to time, place, history taking. Follows all commands speech and language fluent Cranial nerve II-XII:Extraocular movements were full. Facial symmetry noted. uvula tongue midline. Head turning and shoulder shrug  were normal and symmetric. Motor: Good strength throughout subjectively per patient Sensory: Sensory testing is intact to soft touch on all 4 extremities subjectively per patient Coordination: Cerebellar testing reveals good finger-nose-finger   Gait and station: Patient is able to stand from a seated position. gait is normal.  Reflexes: UTA  DIAGNOSTIC DATA (LABS, IMAGING, TESTING) - I reviewed patient records, labs, notes, testing and imaging myself where available.  Lab Results  Component Value Date   WBC 4.1 05/21/2019   HGB 14.0 05/21/2019   HCT 43.2 05/21/2019   MCV 96.9 05/21/2019   PLT 195 05/21/2019      Component Value Date/Time   NA 142 08/18/2020 1018   K 4.7 08/18/2020 1018   CL 101 08/18/2020 1018   CO2 29 08/18/2020 1018   GLUCOSE 95 08/18/2020 1018   GLUCOSE 92 05/21/2019 1022   BUN 11 08/18/2020 1018   CREATININE 1.03 (H) 08/18/2020 1018   CREATININE 0.87 05/21/2019 1022   CALCIUM 10.2 08/18/2020 1018   PROT 6.0 (L) 05/21/2019 1022   PROT 6.5 12/26/2017 1430   ALBUMIN 4.6 12/26/2017 1430   AST 20 05/21/2019 1022   ALT 14 05/21/2019 1022   ALKPHOS 44 12/26/2017 1430   BILITOT 1.0 05/21/2019 1022   BILITOT 0.5 12/26/2017 1430   GFRNONAA 81 12/26/2017 1430   GFRAA 93 12/26/2017 1430   Lab Results  Component Value Date   CHOL 169 05/21/2019   HDL 65 05/21/2019   LDLCALC 90 05/21/2019   TRIG 55 05/21/2019   CHOLHDL 2.6 05/21/2019   No results found for: HGBA1C No results found for: VITAMINB12 Lab Results  Component Value Date   TSH 1.290 08/18/2020      ASSESSMENT AND PLAN 51 y.o. year old female  has a past medical history of Anxiety, Blood transfusion without reported diagnosis, Fibromyalgia, Narcolepsy without cataplexy, Neuromuscular disorder ([place]), Other chronic cystitis without hematuria, Other intervertebral disc displacement, lumbar region, and Palpitations. here with:  1.  Primary narcolepsy  Continues Xyrem- will check on Prescription  Recent blood work through her PCP Follow-up in 6 months or sooner if needed      Ward Givens, MSN, NP-C 04/13/2021, 11:45 AM [facility] [address], [place], [place] [address] [phone]

## 2021-04-15 ENCOUNTER — Ambulatory Visit
Admission: RE | Admit: 2021-04-15 | Discharge: 2021-04-15 | Disposition: A | Discharge status: Home / Self Care | Source: Ambulatory Visit

## 2021-04-15 DIAGNOSIS — Z1231 Encounter for screening mammogram for malignant neoplasm of breast: Secondary | ICD-10-CM

## 2021-04-19 ENCOUNTER — Other Ambulatory Visit: Payer: Self-pay | Admitting: Neurology

## 2021-04-19 MED ORDER — SODIUM OXYBATE 500 MG/ML PO SOLN: ORAL | 1 refills | Status: DC

## 2021-06-06 DIAGNOSIS — M279 Disease of jaws, unspecified: Secondary | ICD-10-CM | POA: Insufficient documentation

## 2021-06-06 DIAGNOSIS — G988 Other disorders of nervous system: Secondary | ICD-10-CM | POA: Insufficient documentation

## 2021-06-06 DIAGNOSIS — D696 Thrombocytopenia, unspecified: Secondary | ICD-10-CM | POA: Insufficient documentation

## 2021-06-06 DIAGNOSIS — N3010 Interstitial cystitis (chronic) without hematuria: Secondary | ICD-10-CM | POA: Insufficient documentation

## 2021-06-06 DIAGNOSIS — M76899 Other specified enthesopathies of unspecified lower limb, excluding foot: Secondary | ICD-10-CM | POA: Insufficient documentation

## 2021-06-06 DIAGNOSIS — M7710 Lateral epicondylitis, unspecified elbow: Secondary | ICD-10-CM | POA: Insufficient documentation

## 2021-06-06 DIAGNOSIS — R002 Palpitations: Secondary | ICD-10-CM | POA: Insufficient documentation

## 2021-06-06 DIAGNOSIS — F909 Attention-deficit hyperactivity disorder, unspecified type: Secondary | ICD-10-CM | POA: Insufficient documentation

## 2021-06-06 DIAGNOSIS — K209 Esophagitis, unspecified without bleeding: Secondary | ICD-10-CM | POA: Insufficient documentation

## 2021-06-06 DIAGNOSIS — R040 Epistaxis: Secondary | ICD-10-CM | POA: Insufficient documentation

## 2021-06-06 DIAGNOSIS — K4090 Unilateral inguinal hernia, without obstruction or gangrene, not specified as recurrent: Secondary | ICD-10-CM | POA: Insufficient documentation

## 2021-06-06 DIAGNOSIS — G4712 Idiopathic hypersomnia without long sleep time: Secondary | ICD-10-CM | POA: Insufficient documentation

## 2021-06-06 DIAGNOSIS — G629 Polyneuropathy, unspecified: Secondary | ICD-10-CM | POA: Insufficient documentation

## 2021-06-06 DIAGNOSIS — F419 Anxiety disorder, unspecified: Secondary | ICD-10-CM | POA: Insufficient documentation

## 2021-06-06 DIAGNOSIS — R1013 Epigastric pain: Secondary | ICD-10-CM | POA: Insufficient documentation

## 2021-06-06 DIAGNOSIS — H8110 Benign paroxysmal vertigo, unspecified ear: Secondary | ICD-10-CM | POA: Insufficient documentation

## 2021-07-20 DIAGNOSIS — E0590 Thyrotoxicosis, unspecified without thyrotoxic crisis or storm: Secondary | ICD-10-CM | POA: Insufficient documentation

## 2021-09-28 ENCOUNTER — Other Ambulatory Visit: Payer: Self-pay | Admitting: Neurology

## 2021-09-28 MED ORDER — SODIUM OXYBATE 500 MG/ML PO SOLN: ORAL | 0 refills | Status: DC

## 2021-10-05 ENCOUNTER — Telehealth (INDEPENDENT_AMBULATORY_CARE_PROVIDER_SITE_OTHER): Admitting: Adult Health

## 2021-10-05 DIAGNOSIS — G47419 Narcolepsy without cataplexy: Secondary | ICD-10-CM

## 2021-10-05 NOTE — Progress Notes (Signed)
PATIENT: Shaquala Broeker DOB: 12/14/1970  REASON FOR VISIT: follow up HISTORY FROM: patient  Virtual Visit via Video Note  I connected with [name] on 10/05/21 at 11:00 AM EDT by a video enabled telemedicine application located remotely at [facility] and verified that I am speaking with the correct person using two identifiers who was located at their own home.   I discussed the limitations of evaluation and management by telemedicine and the availability of in person appointments. The patient expressed understanding and agreed to proceed.   PATIENT: Hadleigh Felber DOB: 08-30-1970  REASON FOR VISIT: follow up HISTORY FROM: patient  HISTORY OF PRESENT ILLNESS: Today 10/05/21:  Ms. Hopkinson is a 51 year old female with a history of narcolepsy.  She returns today for follow-up.  She is currently on Xyrem.  And reports that it is working well.  She states that her insurance has sent her a letter about switching to generic.  Denies any trouble staying awake while driving or at work.  No cataplectic events.  Reviewed blood work through care everywhere  09/29/20: Ms. Rankin is a 51 year old female with a history of narcolepsy.  She returns today for follow-up.  She is currently on Xyrem.  Reports that it is working well.  Denies any difficulty staying awake while driving or working.  Denies any cataplectic events.  She is saying that [facility] is telling her to switch Xyrem to generic form?   REVIEW OF SYSTEMS: Out of a complete 14 system review of symptoms, the patient complains only of the following symptoms, and all other reviewed systems are negative.  ALLERGIES: Allergies  Allergen Reactions   Sulfa Antibiotics     HOME MEDICATIONS: Outpatient Medications Prior to Visit  Medication Sig Dispense Refill   cephALEXin (KEFLEX) 250 MG capsule TAKE 1 CAPSULE DAILY, PROPHYLACTIC 90 capsule 3   DULoxetine (CYMBALTA) 60 MG capsule TAKE 1  CAPSULE TWICE A DAY 180 capsule 3   Sodium Oxybate (XYREM) 500 MG/ML SOLN Take 3.75 grams twice nightly. 270 mL 0   No facility-administered medications prior to visit.    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    Other intervertebral disc displacement, lumbar region    Palpitations     PAST SURGICAL HISTORY: Past Surgical History:  Procedure Laterality Date   AUGMENTATION MAMMAPLASTY     CESAREAN SECTION      FAMILY HISTORY: Family History  Problem Relation Age of Onset   Heart disease Mother    Alcohol abuse Father    Diabetes Maternal Uncle    Arthritis Maternal Grandmother    Cancer Maternal Grandfather     SOCIAL HISTORY: Social History   Socioeconomic History   Marital status: Married    Spouse name: Not on file   Number of children: Not on file   Years of education: Not on file   Highest education level: Not on file  Occupational History   Not on file  Tobacco Use   Smoking status: Former   Smokeless tobacco: Never  Vaping Use   Vaping Use: Never used  Substance and Sexual Activity   Alcohol use: Yes    Alcohol/week: 2.0 standard drinks of alcohol    Types: 2 Glasses of wine per week  Drug use: No   Sexual activity: Yes  Other Topics Concern   Not on file  Social History Narrative   Not on file   Social Determinants of Health   Financial Resource Strain: Not on file  Food Insecurity: Not on file  Transportation Needs: Not on file  Physical Activity: Not on file  Stress: Not on file  Social Connections: Not on file  Intimate Partner Violence: Not on file      PHYSICAL EXAM Generalized: Well developed, in no acute distress   Neurological examination  Mentation: Alert oriented to time, place, history taking. Follows all commands  speech and language fluent Cranial nerve II-XII:Extraocular movements were full. Facial symmetry noted. uvula tongue midline. Head turning and shoulder shrug  were normal and symmetric. Motor: Good strength throughout subjectively per patient Sensory: Sensory testing is intact to soft touch on all 4 extremities subjectively per patient Coordination: Cerebellar testing reveals good finger-nose-finger  Gait and station: Patient is able to stand from a seated position. gait is normal.  Reflexes: UTA  DIAGNOSTIC DATA (LABS, IMAGING, TESTING) - I reviewed patient records, labs, notes, testing and imaging myself where available.  Lab Results  Component Value Date   WBC 4.1 05/21/2019   HGB 14.0 05/21/2019   HCT 43.2 05/21/2019   MCV 96.9 05/21/2019   PLT 195 05/21/2019      Component Value Date/Time   NA 142 08/18/2020 1018   K 4.7 08/18/2020 1018   CL 101 08/18/2020 1018   CO2 29 08/18/2020 1018   GLUCOSE 95 08/18/2020 1018   GLUCOSE 92 05/21/2019 1022   BUN 11 08/18/2020 1018   CREATININE 1.03 (H) 08/18/2020 1018   CREATININE 0.87 05/21/2019 1022   CALCIUM 10.2 08/18/2020 1018   PROT 6.0 (L) 05/21/2019 1022   PROT 6.5 12/26/2017 1430   ALBUMIN 4.6 12/26/2017 1430   AST 20 05/21/2019 1022   ALT 14 05/21/2019 1022   ALKPHOS 44 12/26/2017 1430   BILITOT 1.0 05/21/2019 1022   BILITOT 0.5 12/26/2017 1430   GFRNONAA 81 12/26/2017 1430   GFRAA 93 12/26/2017 1430   Lab Results  Component Value Date   CHOL 169 05/21/2019   HDL 65 05/21/2019   LDLCALC 90 05/21/2019   TRIG 55 05/21/2019   CHOLHDL 2.6 05/21/2019   Lab Results  Component Value Date   TSH 1.290 08/18/2020      ASSESSMENT AND PLAN 51 y.o. year old female  has a past medical history of Anxiety, Blood transfusion without reported diagnosis, Fibromyalgia, Narcolepsy without cataplexy, Neuromuscular disorder ([facility]), Other chronic cystitis without hematuria, Other intervertebral disc displacement, lumbar region, and  Palpitations. here with:  1.  Primary narcolepsy  Continues Xyrem Recent blood work through her PCP reviewed in care everywhere Follow-up in 6 months or sooner if needed      Butch Penny, MSN, NP-C 10/05/2021, 11:08 AM [facility] [address], [address] [place], [place] [address] [phone]

## 2021-10-12 ENCOUNTER — Encounter: Payer: Self-pay | Admitting: *Deleted

## 2021-10-12 NOTE — Telephone Encounter (Signed)
Attempted to do PA for generic Xyrem. Key: [id number]. Received this message from [facility] plan: An active PA is already on file with expiration date of 01/04/2022. Please wait to resubmit request within 60 days of that expiration date to obtain a PA renewal.

## 2021-10-28 ENCOUNTER — Encounter: Payer: Self-pay | Admitting: Adult Health

## 2021-10-28 NOTE — Telephone Encounter (Signed)
Received PA for generic Xyrem. This has been completed, signed by Dr Vickey Huger, and faxed to [facility] [phone]. Received a receipt of confirmation.

## 2021-11-09 ENCOUNTER — Telehealth: Payer: Self-pay | Admitting: Adult Health

## 2021-11-09 MED ORDER — SODIUM OXYBATE 500 MG/ML PO SOLN: ORAL | 5 refills | Status: DC

## 2021-11-09 MED ORDER — SODIUM OXYBATE 500 MG/ML PO SOLN: ORAL | 1 refills | Status: DC

## 2021-11-09 NOTE — Addendum Note (Signed)
Addended by: Gildardo Griffes on: 11/09/2021 11:34 AM   Modules accepted: Orders

## 2021-11-09 NOTE — Telephone Encounter (Signed)
Last visit 10/05/21 Next visit 04/26/22 Checked registry, last filled on 10/28/2021 #428mL/30. Rx refills printed.

## 2021-11-09 NOTE — Telephone Encounter (Signed)
[facility]) request refill for Sodium Oxybate (XYREM) 500 MG/ML SOLN

## 2021-11-09 NOTE — Telephone Encounter (Signed)
Rx refill printed and ready for Dr Dohmeier's signature.

## 2021-12-21 ENCOUNTER — Telehealth: Payer: Self-pay | Admitting: Neurology

## 2021-12-21 NOTE — Telephone Encounter (Signed)
PA completed on [facility] [id number] Approved immediately  CaseId:[id number];Status:Approved;Review Type:Prior Auth;Coverage Start Date:11/21/2021;Coverage End Date:12/21/2022

## 2022-03-21 ENCOUNTER — Other Ambulatory Visit: Payer: Self-pay | Admitting: Physician Assistant

## 2022-03-21 DIAGNOSIS — Z1231 Encounter for screening mammogram for malignant neoplasm of breast: Secondary | ICD-10-CM

## 2022-04-13 ENCOUNTER — Other Ambulatory Visit: Payer: Self-pay | Admitting: Neurology

## 2022-04-13 MED ORDER — SODIUM OXYBATE 500 MG/ML PO SOLN: ORAL | 5 refills | Status: DC

## 2022-04-19 ENCOUNTER — Telehealth: Payer: Self-pay | Admitting: Adult Health

## 2022-04-19 NOTE — Telephone Encounter (Signed)
Heidi Gross is calling from [employer]. Stated he need to talk to nurse about the dose and directions for Sodium Oxybate (XYREM) 500 MG/ML SOLN.

## 2022-04-19 NOTE — Telephone Encounter (Signed)
Returned call to [employer], spoke with Elmyra Ricks there and provided instructions for Xyrem 3.75 grams twice nightly. She verbalized understanding and confirmed via read back.

## 2022-04-25 NOTE — Progress Notes (Signed)
PATIENT: Heidi Gross DOB: Jan 20, 1971  REASON FOR VISIT: follow up HISTORY FROM: patient  Virtual Visit via Video Note  I connected with Heidi Gross on 04/25/22 at 11:00 AM EDT by a video enabled telemedicine application located remotely at [facility] and verified that I am speaking with the correct person using two identifiers who was located at their own home.   I discussed the limitations of evaluation and management by telemedicine and the availability of in person appointments. The patient expressed understanding and agreed to proceed.   PATIENT: Heidi Gross DOB: [birth year]  REASON FOR VISIT: follow up HISTORY FROM: patient  HISTORY OF PRESENT ILLNESS: Today 04/25/22:  Heidi Gross is a 52 y.o. female with a history of Narcolepsy. Returns today for follow-up.  Remains on Xyrem and tolerates it well.  Denies depression or suicidal thoughts.  Denies cataplectic events.  Reviewed her blood work in care everywhere.  She returns today for an evaluation.  10/05/21: Heidi Gross is a 52 year old female with a history of narcolepsy.  She returns today for follow-up.  She is currently on Xyrem.  And reports that it is working well.  She states that her insurance has sent her a letter about switching to generic.  Denies any trouble staying awake while driving or at work.  No cataplectic events.  Reviewed blood work through care everywhere  09/29/20: Heidi Gross is a 52 year old female with a history of narcolepsy.  She returns today for follow-up.  She is currently on Xyrem.  Reports that it is working well.  Denies any difficulty staying awake while driving or working.  Denies any cataplectic events.  She is saying that [facility] is telling her to switch Xyrem to generic form?   REVIEW OF SYSTEMS: Out of a complete 14 system review of symptoms, the patient complains only of the following symptoms, and all other reviewed systems are  negative.  ALLERGIES: Allergies  Allergen Reactions   Sulfa Antibiotics     HOME MEDICATIONS: Outpatient Medications Prior to Visit  Medication Sig Dispense Refill   cephALEXin (KEFLEX) 250 MG capsule TAKE 1 CAPSULE DAILY, PROPHYLACTIC 90 capsule 3   DULoxetine (CYMBALTA) 60 MG capsule TAKE 1 CAPSULE TWICE A DAY 180 capsule 3   Sodium Oxybate (XYREM) 500 MG/ML SOLN Take 3.75 grams twice nightly. 270 mL 5   No facility-administered medications prior to visit.    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    Other intervertebral disc displacement, lumbar region    Palpitations     PAST SURGICAL HISTORY: Past Surgical History:  Procedure Laterality Date   AUGMENTATION MAMMAPLASTY     CESAREAN SECTION      FAMILY HISTORY: Family History  Problem Relation Age of Onset   Heart disease Mother    Alcohol abuse Father    Diabetes Maternal Uncle    Arthritis Maternal Grandmother    Cancer Maternal Grandfather     SOCIAL HISTORY: Social History   Socioeconomic History   Marital status: Married    Spouse name: Not on file   Number of children: Not on file   Years of education: Not on file   Highest education level: Not on file  Occupational History   Not on file  Tobacco Use   Smoking status: Former   Smokeless tobacco: Never  Vaping Use   Vaping Use: Never used  Substance and Sexual Activity   Alcohol use: Yes    Alcohol/week: 2.0 standard drinks of alcohol    Types: 2 Glasses of wine per week   Drug use: No   Sexual activity: Yes  Other Topics Concern   Not on file  Social History Narrative   Not on file   Social Determinants of Health   Financial Resource Strain: Not on file  Food Insecurity: Not on file  Transportation Needs: Not on file  Physical  Activity: Not on file  Stress: Not on file  Social Connections: Not on file  Intimate Partner Violence: Not on file      PHYSICAL EXAM Generalized: Well developed, in no acute distress   Neurological examination  Mentation: Alert oriented to time, place, history taking. Follows all commands speech and language fluent Cranial nerve II-XII: Facial symmetry noted DIAGNOSTIC DATA (LABS, IMAGING, TESTING) - I reviewed patient records, labs, notes, testing and imaging myself where available.  Lab Results  Component Value Date   WBC 4.1 05/21/2019   HGB 14.0 05/21/2019   HCT 43.2 05/21/2019   MCV 96.9 05/21/2019   PLT 195 05/21/2019      Component Value Date/Time   NA 142 08/18/2020 1018   K 4.7 08/18/2020 1018   CL 101 08/18/2020 1018   CO2 29 08/18/2020 1018   GLUCOSE 95 08/18/2020 1018   GLUCOSE 92 05/21/2019 1022   BUN 11 08/18/2020 1018   CREATININE 1.03 (H) 08/18/2020 1018   CREATININE 0.87 05/21/2019 1022   CALCIUM 10.2 08/18/2020 1018   PROT 6.0 (L) 05/21/2019 1022   PROT 6.5 12/26/2017 1430   ALBUMIN 4.6 12/26/2017 1430   AST 20 05/21/2019 1022   ALT 14 05/21/2019 1022   ALKPHOS 44 12/26/2017 1430   BILITOT 1.0 05/21/2019 1022   BILITOT 0.5 12/26/2017 1430   GFRNONAA 81 12/26/2017 1430   GFRAA 93 12/26/2017 1430   Lab Results  Component Value Date   CHOL 169 05/21/2019   HDL 65 05/21/2019   LDLCALC 90 05/21/2019   TRIG 55 05/21/2019   CHOLHDL 2.6 05/21/2019   Lab Results  Component Value Date   TSH 1.290 08/18/2020      ASSESSMENT AND PLAN 52 y.o. 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 here with:  1.  Primary narcolepsy  Continues Xyrem Recent blood work through her PCP reviewed in care  everywhere Follow-up in 6 months or sooner if needed      Butch Penny, MSN, NP-C 04/25/2022, 4:11 PM [facility] [address] [place], [place] [address] [phone]

## 2022-04-26 ENCOUNTER — Telehealth (INDEPENDENT_AMBULATORY_CARE_PROVIDER_SITE_OTHER): Admitting: Adult Health

## 2022-04-26 DIAGNOSIS — G47419 Narcolepsy without cataplexy: Secondary | ICD-10-CM

## 2022-05-05 ENCOUNTER — Ambulatory Visit
Admission: RE | Admit: 2022-05-05 | Discharge: 2022-05-05 | Disposition: A | Discharge status: Home / Self Care | Source: Ambulatory Visit

## 2022-05-05 DIAGNOSIS — Z1231 Encounter for screening mammogram for malignant neoplasm of breast: Secondary | ICD-10-CM

## 2022-06-07 DIAGNOSIS — N951 Menopausal and female climacteric states: Secondary | ICD-10-CM | POA: Insufficient documentation

## 2022-06-28 ENCOUNTER — Telehealth: Payer: Self-pay | Admitting: Adult Health

## 2022-06-28 NOTE — Telephone Encounter (Signed)
Heidi Gross called from [facility]. Stated she needs to speak with nurse about drug interaction between Sodium Oxybate (XYREM) 500 MG/ML SOLN  and Gabapentin.

## 2022-06-29 NOTE — Telephone Encounter (Addendum)
Pt called back. Requesting a call back. Stated it's best to call her back tomorrow morning around 8 am

## 2022-06-29 NOTE — Telephone Encounter (Signed)
I don't see Gabapentin on the pt's med list but only what's below. I called the pt and LVM asking for call back at her earliest convenience. I prefer to get an updated med list from patient before we call ES back.

## 2022-06-29 NOTE — Telephone Encounter (Addendum)
I called pt and LMVM for her relating to the question about gabapentin and sodium oxybate.   06-08-2022 gabapentin by  [facility].

## 2022-06-30 NOTE — Addendum Note (Signed)
Addended by: Bertram Savin on: 06/30/2022 09:40 AM   Modules accepted: Orders

## 2022-06-30 NOTE — Telephone Encounter (Signed)
That's fine

## 2022-06-30 NOTE — Telephone Encounter (Signed)
I called [facility] back and spoke with [facility]. [phone] . I provided verbal authorization from [facility] NP stating ok for patient to be on both Xyrem and Gabapentin as the patient is aware of the risks and will take it 6 hours prior to Xyrem. Cathy verbalized understanding. No further action needed.

## 2022-06-30 NOTE — Telephone Encounter (Signed)
Patient returned our call. We went over her medication list and added new medications. Gabapentin is 100 mg and she started with 1 daily, now is at 2 daily for 2 weeks then she will take 3 daily for 2 weeks then follow-up with rheumatologist. Patient states he had a counseling session with the pharmacy and she is aware of the interaction/risks between Gabapentin and Xyrem and she will take Gabapentin in the early afternoon 6 hours before her Xyrem. Patient is planning to discuss her pharmacy counseling session with her rheumatologist at her appointment in case they want to make any changes. Patient verbalized appreciation for our call.

## 2022-06-30 NOTE — Telephone Encounter (Signed)
I called Zoya back at [facility]. She confirmed the concern with the Gabapentin and Xyrem was risk for CNS depression. I told her I would clarify with Megan NP and since patient is aware and will be taking the meds 6 hour apart and will be following-up with rheumatology, then it's probably ok, but I will call her back to confirm.

## 2022-09-14 ENCOUNTER — Other Ambulatory Visit: Payer: Self-pay | Admitting: Neurology

## 2022-09-14 MED ORDER — SODIUM OXYBATE 500 MG/ML PO SOLN: ORAL | 2 refills | Status: DC

## 2022-09-16 ENCOUNTER — Telehealth: Payer: Self-pay | Admitting: *Deleted

## 2022-09-16 NOTE — Telephone Encounter (Signed)
Received fax from [facility] program seeking clarification on total nightly dose. Patient is prescribed 3.75 g x 2 nightly, total dose of 7.5 g nightly. I provided clarification to Adelina Mings, pharmacist at [facility], via telephone call. She verbalized appreciation for the call and gave verbal read back.

## 2022-10-04 DIAGNOSIS — M069 Rheumatoid arthritis, unspecified: Secondary | ICD-10-CM | POA: Insufficient documentation

## 2022-10-04 DIAGNOSIS — E041 Nontoxic single thyroid nodule: Secondary | ICD-10-CM | POA: Insufficient documentation

## 2022-11-08 DIAGNOSIS — M47812 Spondylosis without myelopathy or radiculopathy, cervical region: Secondary | ICD-10-CM | POA: Insufficient documentation

## 2022-11-17 ENCOUNTER — Ambulatory Visit: Admitting: Family Medicine

## 2022-11-17 ENCOUNTER — Encounter: Payer: Self-pay | Admitting: Family Medicine

## 2022-11-17 VITALS — BP 133/87 | Ht 63.0 in | Wt 116.0 lb

## 2022-11-17 DIAGNOSIS — G5701 Lesion of sciatic nerve, right lower limb: Secondary | ICD-10-CM | POA: Diagnosis not present

## 2022-11-17 NOTE — Progress Notes (Signed)
    SUBJECTIVE:   CHIEF COMPLAINT / HPI: right hip pain  Ongoing 5 months Gradual onset Intermittent worse with activity Does yoga daily and spin class Worse with any external and internal rotation of her hip Hx of bulging disc for which she gets regular back injections - Had a back injection in the last 5 months that helped back pain, but did not relieve her hip pain No numbness or tingling Pain does radiate down to mid thigh from buttock on the right Pain is sharp Has not taken any medications to help Not taking Methotrexate, has switched to plaquenil Has not done PT or HEP for this No prior imaging  PERTINENT  PMH / PSH: Rheumatoid arthritis, hx of disc herniation  OBJECTIVE:   BP 133/87   Ht 5\' 3"  (1.6 m)   Wt 116 lb (52.6 kg)   BMI 20.55 kg/m   General: NAD, well appearing Neuro: A&O Respiratory: normal WOB on RA Extremities: Moving all 4 extremities equally  Right hip Inspection: No obvious deformity, erythema, swelling, ecchymoses  Palpation: mildly tender to palpation along piriformis, NTTP at greater trochanter, lumbar spinous processes, lumbar paraspinous muscles, ischial tuberosity ROM: full ROM bilateral hips in flexion, extension, internal and external rotation, adduction, abduction Special Tests: Positive piriformis stretch, negative right sided straight leg raise, negative Ober's sign Strength: Strength 5/5 bilateral foot plantar flexion and dorsiflexion, 5/5 hip extension, adduction, abduction Normal gait No leg length difference Neurovascular: sensation intact to light touch lower ext b/l Additional: No pain with resisted hip internal and external rotation   ASSESSMENT/PLAN:   Assessment & Plan Piriformis syndrome of right side Right posterior hip pain due to piriformis syndrome. Low suspicion for pain from lumbar spine as previous spine injections did not reduce pain.  Exam not consistent with greater trochanter pathology or hamstring injury.    Discussed conservative treatment with below. -Home exercise regimen - piriformis strengthening exercises -Limit motions and exercise that cause pain -Tylenol and Ibuprofen as needed - tennis ball massage prn -Follow-up 6 weeks if no improvement, sooner prn.  Consider imaging vs formal PT if not improving  Return in about 6 weeks (around 12/29/2022).   Celine Mans, MD, PGY-2 [facility] 5:04 PM 11/17/2022   DATE OF VISIT: 11/17/2022       Heidi Gross DOB: 01-Sep-1970 MRN: [id number]  ATTENDING ADDENDUM:  Patient seen and examined with resident physician, Celine Mans, MD.  I agree with findings and plan as noted in resident note, updates made to note above. Please refer to resident's note for additional details of the visit.   Encounter Diagnosis  Name Primary?   Piriformis syndrome of right side Yes    Orders Placed This Encounter  Procedures   Ambulatory referral to Physical Therapy

## 2022-11-24 ENCOUNTER — Telehealth: Admitting: Adult Health

## 2022-11-24 DIAGNOSIS — G47419 Narcolepsy without cataplexy: Secondary | ICD-10-CM

## 2022-11-24 NOTE — Patient Instructions (Signed)
Your Plan:  Continue xyrem If you want to consider xywav let me know If your symptoms worsen or you develop new symptoms please let us know.    Thank you for coming to see [place] at [facility]. I hope we have been able to provide you high quality care today.  You may receive a patient satisfaction survey over the next few weeks. We would appreciate your feedback and comments so that we may continue to improve ourselves and the health of our patients.

## 2022-11-24 NOTE — Progress Notes (Signed)
PATIENT: Heidi Gross DOB: [birth year]  REASON FOR VISIT: follow up HISTORY FROM: patient  Virtual Visit via Video Note  I connected with Heidi Gross on 11/24/22 at  2:30 PM EDT by a video enabled telemedicine application located remotely at [facility] and verified that I am speaking with the correct person using two identifiers who was located at their own home.   I discussed the limitations of evaluation and management by telemedicine and the availability of in person appointments. The patient expressed understanding and agreed to proceed.   PATIENT: Heidi Gross DOB: 01/30/1971  REASON FOR VISIT: follow up HISTORY FROM: patient  HISTORY OF PRESENT ILLNESS: Today 11/24/22:  Heidi Gross is a 52 y.o. female with a history of narcolepsy. Returns today for follow-up.  She remains on Xyrem.  She had blood work through her PCP.  Her sodium was in normal range but on the higher end.  She is questioning whether she needs to switch to [facility]  Denies any thoughts of suicide or depression.  No cataplectic events.  Xyrem continues to work well for her.     04/26/22: Heidi Gross is a 52 y.o. female with a history of Narcolepsy. Returns today for follow-up.  Remains on Xyrem and tolerates it well.  Denies depression or suicidal thoughts.  Denies cataplectic events.  Reviewed her blood work in care everywhere.  She returns today for an evaluation.  10/05/21: Heidi Gross is a 52 year old female with a history of narcolepsy.  She returns today for follow-up.  She is currently on Xyrem.  And reports that it is working well.  She states that her insurance has sent her a letter about switching to generic.  Denies any trouble staying awake while driving or at work.  No cataplectic events.  Reviewed blood work through care everywhere  09/29/20: Heidi Gross is a 52 year old female with a history of narcolepsy.  She returns today for follow-up.  She is  currently on Xyrem.  Reports that it is working well.  Denies any difficulty staying awake while driving or working.  Denies any cataplectic events.  She is saying that [employer] is telling her to switch Xyrem to generic form?   REVIEW OF SYSTEMS: Out of a complete 14 system review of symptoms, the patient complains only of the following symptoms, and all other reviewed systems are negative.  ALLERGIES: Allergies  Allergen Reactions   Sulfa Antibiotics     HOME MEDICATIONS: Outpatient Medications Prior to Visit  Medication Sig Dispense Refill   cephALEXin (KEFLEX) 250 MG capsule TAKE 1 CAPSULE DAILY, PROPHYLACTIC 90 capsule 3   DULoxetine (CYMBALTA) 60 MG capsule TAKE 1 CAPSULE TWICE A DAY 180 capsule 3   estradiol (ESTRACE) 1 MG tablet Take 1 mg by mouth daily.     gabapentin (NEURONTIN) 100 MG capsule 2 capsules daily x 2 weeks then 3 capsules daily x 2 weeks then follow-up with rheumatologist     hydroxychloroquine (PLAQUENIL) 200 MG tablet Take 200 mg by mouth daily.     progesterone (PROMETRIUM) 100 MG capsule Take 100 mg by mouth daily.     Sodium Oxybate (XYREM) 500 MG/ML SOLN Take 3.75 grams twice nightly. 270 mL 2   UNABLE TO FIND Take by mouth every evening. Vitamin Pack     No facility-administered medications prior to visit.    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    Other intervertebral disc displacement, lumbar region    Palpitations     PAST SURGICAL HISTORY: Past Surgical History:  Procedure Laterality Date   AUGMENTATION MAMMAPLASTY     CESAREAN SECTION      FAMILY HISTORY: Family History  Problem Relation Age of Onset   Heart disease Mother    Alcohol abuse Father    Diabetes Maternal Uncle    Arthritis Maternal Grandmother    Cancer  Maternal Grandfather     SOCIAL HISTORY: Social History   Socioeconomic History   Marital status: Married    Spouse name: Not on file   Number of children: Not on file   Years of education: Not on file   Highest education level: Not on file  Occupational History   Not on file  Tobacco Use   Smoking status: Former   Smokeless tobacco: Never  Vaping Use   Vaping status: Never Used  Substance and Sexual Activity   Alcohol use: Yes    Alcohol/week: 2.0 standard drinks of alcohol    Types: 2 Glasses of wine per week   Drug use: No   Sexual activity: Yes  Other Topics Concern   Not on file  Social History Narrative   Not on file   Social Determinants of Health   Financial Resource Strain: Low Risk  (11/05/2022)   Received from [facility]   Overall Financial Resource Strain (CARDIA)    Difficulty of Paying Living Expenses: Not hard at all  Food Insecurity: No Food Insecurity (11/05/2022)   Received from [facility]   Hunger Vital Sign    Worried About Running Out of Food in the Last Year: Never true    Ran Out of Food in the Last Year: Never true  Transportation Needs: No Transportation Needs (11/05/2022)   Received from [facility] - Transportation    Lack of Transportation (Medical): No    Lack of Transportation (Non-Medical): No  Physical Activity: Sufficiently Active (11/05/2022)   Received from [facility]   Exercise Vital Sign    Days of Exercise per Week: 5 days    Minutes of Exercise per Session: 90 min  Stress: Stress Concern Present (11/05/2022)   Received from [facility] - Occupational Stress Questionnaire    Feeling of Stress : To some extent  Social Connections: Socially Integrated (11/05/2022)   Received from [facility]   Social Network    How would you rate your social network (family, work, friends)?: Good participation with social networks  Intimate Partner Violence: Not At Risk  (11/05/2022)   Received from [facility]   HITS    Over the last 12 months how often did your partner physically hurt you?: 1    Over the last 12 months how often did your partner insult you or talk down to you?: 1    Over the last 12 months how often did your partner threaten you with physical harm?: 1    Over the last 12 months how often did your partner scream or curse at you?: 1      PHYSICAL EXAM Generalized: Well developed, in no acute distress   Neurological examination  Mentation: Alert oriented to time, place, history taking. Follows all commands speech and language fluent Cranial nerve II-XII: Facial  symmetry noted DIAGNOSTIC DATA (LABS, IMAGING, TESTING) - I reviewed patient records, labs, notes, testing and imaging myself where available.  Lab Results  Component Value Date   WBC 4.1 05/21/2019   HGB 14.0 05/21/2019   HCT 43.2 05/21/2019   MCV 96.9 05/21/2019   PLT 195 05/21/2019      Component Value Date/Time   NA 142 08/18/2020 1018   K 4.7 08/18/2020 1018   CL 101 08/18/2020 1018   CO2 29 08/18/2020 1018   GLUCOSE 95 08/18/2020 1018   GLUCOSE 92 05/21/2019 1022   BUN 11 08/18/2020 1018   CREATININE 1.03 (H) 08/18/2020 1018   CREATININE 0.87 05/21/2019 1022   CALCIUM 10.2 08/18/2020 1018   PROT 6.0 (L) 05/21/2019 1022   PROT 6.5 12/26/2017 1430   ALBUMIN 4.6 12/26/2017 1430   AST 20 05/21/2019 1022   ALT 14 05/21/2019 1022   ALKPHOS 44 12/26/2017 1430   BILITOT 1.0 05/21/2019 1022   BILITOT 0.5 12/26/2017 1430   GFRNONAA 81 12/26/2017 1430   GFRAA 93 12/26/2017 1430   Lab Results  Component Value Date   CHOL 169 05/21/2019   HDL 65 05/21/2019   LDLCALC 90 05/21/2019   TRIG 55 05/21/2019   CHOLHDL 2.6 05/21/2019   Lab Results  Component Value Date   TSH 1.290 08/18/2020      ASSESSMENT AND PLAN 52 y.o. 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 here with:  1.  Primary narcolepsy  Continues Xyrem Advised that we would only be concerned about her sodium if it was not in normal range.  However if she would like to try Xywav I am amenable to send in the order.  She will let us know if she wants to make the switch. Recent blood work through her PCP reviewed in care everywhere Follow-up in 6 months or sooner if needed      Butch Penny, MSN, NP-C 11/24/2022, 2:14 PM [facility] [address], [address] [place], [place] [address] [phone]

## 2022-12-29 ENCOUNTER — Telehealth: Payer: Self-pay

## 2022-12-29 NOTE — Telephone Encounter (Signed)
*  [facility]  Pharmacy Patient Advocate Encounter  Received notification from [employer] that Prior Authorization for Xyrem 500MG /ML solution  has been APPROVED from 12/29/2022 to 12/29/2023   PA #/Case ID/Reference #: Heidi [id number]

## 2023-02-21 ENCOUNTER — Other Ambulatory Visit: Payer: Self-pay | Admitting: Neurology

## 2023-02-21 MED ORDER — SODIUM OXYBATE 500 MG/ML PO SOLN: ORAL | 5 refills | Status: DC

## 2023-05-01 ENCOUNTER — Other Ambulatory Visit: Payer: Self-pay | Admitting: Physician Assistant

## 2023-05-01 DIAGNOSIS — Z1231 Encounter for screening mammogram for malignant neoplasm of breast: Secondary | ICD-10-CM

## 2023-05-08 ENCOUNTER — Ambulatory Visit

## 2023-05-08 ENCOUNTER — Ambulatory Visit: Admitting: Family Medicine

## 2023-05-11 ENCOUNTER — Ambulatory Visit

## 2023-05-15 ENCOUNTER — Ambulatory Visit (INDEPENDENT_AMBULATORY_CARE_PROVIDER_SITE_OTHER): Admitting: Family Medicine

## 2023-05-15 ENCOUNTER — Encounter: Payer: Self-pay | Admitting: Family Medicine

## 2023-05-15 VITALS — BP 105/71 | Ht 63.0 in | Wt 111.0 lb

## 2023-05-15 DIAGNOSIS — M5416 Radiculopathy, lumbar region: Secondary | ICD-10-CM | POA: Diagnosis not present

## 2023-05-15 NOTE — Patient Instructions (Signed)
 We will go ahead with an MRI of your lumbar spine to assess for a radicular source. Your hip exam is normal other than some gluteus minimus tenderness and the 2.5+ months of physical therapy and home exercises since then should have resolved your pain if this is a hip issue. Ok to continue with those exercises in the meantime. We will contact you with the results and next steps.

## 2023-05-15 NOTE — Progress Notes (Signed)
 PCP: Ladora Daniel, PA-C  Subjective:   HPI: Patient is a 53 y.o. female here for right hip pain.  She's continued to do home exercises from physical therapy for right hip. Completed 2.5 months of physical therapy. Pain felt primarily in superior gluteal region. Pain radiates anteriorly though with pinching. Leg loses function and gives out at times too. She's not taking any medications for this. Doing yoga, barre, pilates as well No back pain, red flag symptoms.  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     gabapentin (NEURONTIN) 100 MG capsule 2 capsules daily x 2 weeks then 3 capsules daily x 2 weeks then follow-up with rheumatologist     hydroxychloroquine (PLAQUENIL) 200 MG tablet Take 200 mg by mouth daily.     progesterone (PROMETRIUM) 100 MG capsule Take 100 mg by mouth daily.     Sodium Oxybate (XYREM) 500 MG/ML SOLN Take 3.75 grams twice nightly. 270 mL 5   UNABLE TO FIND Take by mouth every evening. Vitamin Pack     No current facility-administered medications on file prior to visit.    Past Surgical History:  Procedure Laterality Date   AUGMENTATION MAMMAPLASTY     CESAREAN SECTION      Allergies  Allergen Reactions   Sulfa Antibiotics     BP 105/71   Ht 5\' 3"  (1.6 m)   Wt 111 lb (50.3 kg)   BMI 19.66 kg/m       No data to display              No data to display               Objective:  Physical Exam:  Gen: NAD, comfortable in exam room  Back: No gross deformity, scoliosis. No paraspinal tenderness.  No midline or bony TTP. FROM. Strength LEs 5/5 all muscle groups.   Negative SLRs. Sensation intact to light touch bilaterally.  Right hip: No deformity. Full range of motion with 5/5 strength. Mild tenderness to palpation gluteus minimus. Neurovascularly intact distally. Negative logroll Negative faber, fadir, and piriformis stretches.   Assessment & Plan:  1. Right hip/leg pain - patient has completed extensive physical therapy and continues home exercises past several months but unfortunately continues to have pain right gluteal region.  Radiates into groin with giving out of right leg.  Concern for mid-upper lumbar radiculopathy.  Will go ahead with MRI to further assess.  Tylenol, nsaids if needed.

## 2023-05-22 ENCOUNTER — Ambulatory Visit
Admission: RE | Admit: 2023-05-22 | Discharge: 2023-05-22 | Disposition: A | Discharge status: Home / Self Care | Source: Ambulatory Visit | Attending: Family Medicine | Admitting: Family Medicine

## 2023-05-22 DIAGNOSIS — M5416 Radiculopathy, lumbar region: Secondary | ICD-10-CM

## 2023-06-05 ENCOUNTER — Encounter: Payer: Self-pay | Admitting: Family Medicine

## 2023-06-05 ENCOUNTER — Telehealth (INDEPENDENT_AMBULATORY_CARE_PROVIDER_SITE_OTHER): Admitting: Family Medicine

## 2023-06-05 DIAGNOSIS — M5416 Radiculopathy, lumbar region: Secondary | ICD-10-CM

## 2023-06-06 ENCOUNTER — Encounter: Payer: Self-pay | Admitting: Family Medicine

## 2023-06-06 NOTE — Progress Notes (Signed)
 Called patient to review MRI results and next steps.  As expected she does have progression of findings in lumbar spine, especially at L4-5 level where she has severe disc height loss, moderate facet arthrosis, moderate right foraminal stenosis with contact to L4 nerve root - suspect this is the cause of her pain.  She did have an epidural with Dr. Ellery Guthrie last year without much benefit however (believe this was in August though results not available).  She has done extensive physical therapy as well.  Advised she follow back up with him to discuss next steps given these findings, distribution, giving out of her right leg that she's having.

## 2023-06-13 ENCOUNTER — Ambulatory Visit
Admission: RE | Admit: 2023-06-13 | Discharge: 2023-06-13 | Disposition: A | Discharge status: Home / Self Care | Source: Ambulatory Visit | Attending: Physician Assistant

## 2023-06-13 DIAGNOSIS — Z1231 Encounter for screening mammogram for malignant neoplasm of breast: Secondary | ICD-10-CM

## 2023-07-19 ENCOUNTER — Other Ambulatory Visit: Payer: Self-pay

## 2023-07-19 ENCOUNTER — Other Ambulatory Visit: Payer: Self-pay | Admitting: Neurological Surgery

## 2023-07-19 DIAGNOSIS — D696 Thrombocytopenia, unspecified: Secondary | ICD-10-CM | POA: Insufficient documentation

## 2023-07-19 DIAGNOSIS — Z7189 Other specified counseling: Secondary | ICD-10-CM | POA: Insufficient documentation

## 2023-07-19 DIAGNOSIS — Z331 Pregnant state, incidental: Secondary | ICD-10-CM | POA: Insufficient documentation

## 2023-07-19 DIAGNOSIS — Z3480 Encounter for supervision of other normal pregnancy, unspecified trimester: Secondary | ICD-10-CM | POA: Insufficient documentation

## 2023-07-19 DIAGNOSIS — M25579 Pain in unspecified ankle and joints of unspecified foot: Secondary | ICD-10-CM | POA: Insufficient documentation

## 2023-07-19 DIAGNOSIS — F32A Depression, unspecified: Secondary | ICD-10-CM | POA: Insufficient documentation

## 2023-07-19 DIAGNOSIS — Z0000 Encounter for general adult medical examination without abnormal findings: Secondary | ICD-10-CM | POA: Insufficient documentation

## 2023-07-19 DIAGNOSIS — Z3490 Encounter for supervision of normal pregnancy, unspecified, unspecified trimester: Secondary | ICD-10-CM | POA: Insufficient documentation

## 2023-07-19 DIAGNOSIS — R049 Hemorrhage from respiratory passages, unspecified: Secondary | ICD-10-CM | POA: Insufficient documentation

## 2023-07-19 DIAGNOSIS — M25519 Pain in unspecified shoulder: Secondary | ICD-10-CM | POA: Insufficient documentation

## 2023-07-19 DIAGNOSIS — M7060 Trochanteric bursitis, unspecified hip: Secondary | ICD-10-CM | POA: Insufficient documentation

## 2023-07-19 DIAGNOSIS — M26629 Arthralgia of temporomandibular joint, unspecified side: Secondary | ICD-10-CM | POA: Insufficient documentation

## 2023-07-19 DIAGNOSIS — G890 Central pain syndrome: Secondary | ICD-10-CM | POA: Insufficient documentation

## 2023-07-19 DIAGNOSIS — G4710 Hypersomnia, unspecified: Secondary | ICD-10-CM | POA: Insufficient documentation

## 2023-07-19 DIAGNOSIS — R519 Headache, unspecified: Secondary | ICD-10-CM | POA: Insufficient documentation

## 2023-07-19 DIAGNOSIS — N905 Atrophy of vulva: Secondary | ICD-10-CM | POA: Insufficient documentation

## 2023-07-19 DIAGNOSIS — M5126 Other intervertebral disc displacement, lumbar region: Secondary | ICD-10-CM | POA: Insufficient documentation

## 2023-07-19 DIAGNOSIS — M79646 Pain in unspecified finger(s): Secondary | ICD-10-CM | POA: Insufficient documentation

## 2023-07-19 DIAGNOSIS — Z0189 Encounter for other specified special examinations: Secondary | ICD-10-CM | POA: Insufficient documentation

## 2023-07-19 DIAGNOSIS — M545 Low back pain, unspecified: Secondary | ICD-10-CM | POA: Insufficient documentation

## 2023-07-19 DIAGNOSIS — M5416 Radiculopathy, lumbar region: Secondary | ICD-10-CM | POA: Insufficient documentation

## 2023-07-24 ENCOUNTER — Other Ambulatory Visit: Payer: Self-pay

## 2023-07-25 ENCOUNTER — Other Ambulatory Visit: Payer: Self-pay | Admitting: Neurology

## 2023-07-25 MED ORDER — SODIUM OXYBATE 500 MG/ML PO SOLN: ORAL | 5 refills | Status: DC

## 2023-08-01 ENCOUNTER — Telehealth: Admitting: Adult Health

## 2023-08-01 DIAGNOSIS — G47419 Narcolepsy without cataplexy: Secondary | ICD-10-CM

## 2023-08-01 NOTE — Progress Notes (Signed)
 PATIENT: Heidi Gross DOB: 1970/08/20  REASON FOR VISIT: follow up HISTORY FROM: patient  Virtual Visit via Video Note  I connected with Alejo Amsler on 08/01/23 at  3:15 PM EDT by a video enabled telemedicine application located remotely at home office and verified that I am speaking with the correct person using two identifiers who was located at their own home in [place].    I discussed the limitations of evaluation and management by telemedicine and the availability of in person appointments. The patient expressed understanding and agreed to proceed.   PATIENT: Heidi Gross DOB: Jan 16, 1971  REASON FOR VISIT: follow up HISTORY FROM: patient  HISTORY OF PRESENT ILLNESS: Today 08/01/23:  Heidi Gross is a 53 y.o. female with a history of narcolepsy. Returns today for follow-up.  She remains on Xyrem .  She reports that this has been working well for her.  Denies any trouble staying awake while at work or driving.  Denies cataplectic events.  Denies any thoughts of suicide or depression.  She receives blood work through her PCP.  She states that she will be having back surgery in July.  She states that she is aware that she cannot take Xyrem  when she is on pain medicine.  She also plans to let Xyrem  pharmacy know as well.  Returns today for an evaluation.  11/24/22: Heidi Gross is a 53 y.o. female with a history of narcolepsy. Returns today for follow-up.  She remains on Xyrem .  She had blood work through her PCP.  Her sodium was in normal range but on the higher end.  She is questioning whether she needs to switch to [facility].  Denies any thoughts of suicide or depression.  No cataplectic events.  Xyrem  continues to work well for her.  04/26/22: Heidi Gross is a 53 y.o. female with a history of Narcolepsy. Returns today for follow-up.  Remains on Xyrem  and tolerates it well.  Denies depression or suicidal thoughts.  Denies cataplectic events.  Reviewed her blood  work in care everywhere.  She returns today for an evaluation.  10/05/21: Heidi Gross is a 53 year old female with a history of narcolepsy.  She returns today for follow-up.  She is currently on Xyrem .  And reports that it is working well.  She states that her insurance has sent her a letter about switching to generic.  Denies any trouble staying awake while driving or at work.  No cataplectic events.  Reviewed blood work through care everywhere  09/29/20: Heidi Gross is a 53 year old female with a history of narcolepsy.  She returns today for follow-up.  She is currently on Xyrem .  Reports that it is working well.  Denies any difficulty staying awake while driving or working.  Denies any cataplectic events.  She is saying that [employer] is telling her to switch Xyrem  to generic form?   REVIEW OF SYSTEMS: Out of a complete 14 system review of symptoms, the patient complains only of the following symptoms, and all other reviewed systems are negative.  ALLERGIES: Allergies  Allergen Reactions   Sulfa Antibiotics Other (See Comments) and Dermatitis   Misc. Sulfonamide Containing Compounds Dermatitis    HOME MEDICATIONS: Outpatient Medications Prior to Visit  Medication Sig Dispense Refill   azithromycin (ZITHROMAX) 250 MG tablet Take by mouth.     cephALEXin  (KEFLEX ) 250 MG capsule Take 250 mg by mouth.     Cephalexin  250 MG tablet      clobetasol (  TEMOVATE) 0.05 % external solution SMARTSIG:1 Application Topical 6 Times a Week     DULoxetine  (CYMBALTA ) 60 MG capsule      estradiol (ESTRACE) 1 MG tablet Take 1 mg by mouth daily.     folic acid (FOLVITE) 1 MG tablet      gabapentin (NEURONTIN) 100 MG capsule 2 capsules daily x 2 weeks then 3 capsules daily x 2 weeks then follow-up with rheumatologist     hydroxychloroquine (PLAQUENIL) 200 MG tablet Take 200 mg by mouth daily.     ketoconazole (NIZORAL) 2 % shampoo Apply topically.     metoprolol succinate (TOPROL-XL) 25 MG 24 hr tablet  Take 25 mg by mouth daily.     predniSONE (DELTASONE) 10 MG tablet      progesterone (PROMETRIUM) 100 MG capsule      Sodium Oxybate  (XYREM ) 500 MG/ML SOLN 3.75 g twice nightly 180 mL 5   UNABLE TO FIND Take by mouth every evening. Vitamin Pack     No facility-administered medications prior to visit.    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    Other intervertebral disc displacement, lumbar region    Palpitations     PAST SURGICAL HISTORY: Past Surgical History:  Procedure Laterality Date   AUGMENTATION MAMMAPLASTY     CESAREAN SECTION      FAMILY HISTORY: Family History  Problem Relation Age of Onset   Heart disease Mother    Alcohol abuse Father    Diabetes Maternal Uncle    Arthritis Maternal Grandmother    Cancer Maternal Grandfather     SOCIAL HISTORY: Social History   Socioeconomic History   Marital status: Married    Spouse name: Not on file   Number of children: Not on file   Years of education: Not on file   Highest education level: Not on file  Occupational History   Not on file  Tobacco Use   Smoking status: Former   Smokeless tobacco: Never  Vaping Use   Vaping status: Never Used  Substance and Sexual Activity   Alcohol use: Yes    Alcohol/week: 2.0 standard drinks of alcohol    Types: 2 Glasses of wine per week   Drug use: No   Sexual activity: Yes  Other Topics Concern   Not on file  Social History Narrative   Not on file   Social Drivers of Health   Financial Resource Strain: Low Risk  (11/05/2022)   Received from [facility]   Overall Financial Resource Strain (CARDIA)    Difficulty of Paying Living Expenses: Not hard at all  Food Insecurity: No Food Insecurity (11/05/2022)   Received from [facility]   Hunger Vital Sign     Within the past 12 months, you worried that your food would run out before you got the money to buy more.: Never true    Within the past 12 months, the food you bought just didn't last and you didn't have money to get more.: Never true  Transportation Needs: No Transportation Needs (11/05/2022)   Received from [facility] - Transportation    Lack of Transportation (Medical): No    Lack of Transportation (Non-Medical): No  Physical Activity: Sufficiently Active (11/05/2022)   Received from [facility]  Exercise Vital Sign    On average, how many days per week do you engage in moderate to strenuous exercise (like a brisk walk)?: 5 days    On average, how many minutes do you engage in exercise at this level?: 90 min  Stress: Stress Concern Present (11/05/2022)   Received from [facility] - Occupational Stress Questionnaire    Feeling of Stress : To some extent  Social Connections: Socially Integrated (11/05/2022)   Received from [facility]   Social Network    How would you rate your social network (family, work, friends)?: Good participation with social networks  Intimate Partner Violence: Not At Risk (11/05/2022)   Received from [facility]   HITS    Over the last 12 months how often did your partner physically hurt you?: Never    Over the last 12 months how often did your partner insult you or talk down to you?: Never    Over the last 12 months how often did your partner threaten you with physical harm?: Never    Over the last 12 months how often did your partner scream or curse at you?: Never      PHYSICAL EXAM Generalized: Well developed, in no acute distress   Neurological examination  Mentation: Alert oriented to time, place, history taking. Follows all commands speech and language fluent Cranial nerve II-XII: Facial symmetry noted DIAGNOSTIC DATA (LABS, IMAGING, TESTING) - I reviewed patient records, labs, notes,  testing and imaging myself where available.  Lab Results  Component Value Date   WBC 4.1 05/21/2019   HGB 14.0 05/21/2019   HCT 43.2 05/21/2019   MCV 96.9 05/21/2019   PLT 195 05/21/2019      Component Value Date/Time   NA 142 08/18/2020 1018   K 4.7 08/18/2020 1018   CL 101 08/18/2020 1018   CO2 29 08/18/2020 1018   GLUCOSE 95 08/18/2020 1018   GLUCOSE 92 05/21/2019 1022   BUN 11 08/18/2020 1018   CREATININE 1.03 (H) 08/18/2020 1018   CREATININE 0.87 05/21/2019 1022   CALCIUM 10.2 08/18/2020 1018   PROT 6.0 (L) 05/21/2019 1022   PROT 6.5 12/26/2017 1430   ALBUMIN 4.6 12/26/2017 1430   AST 20 05/21/2019 1022   ALT 14 05/21/2019 1022   ALKPHOS 44 12/26/2017 1430   BILITOT 1.0 05/21/2019 1022   BILITOT 0.5 12/26/2017 1430   GFRNONAA 81 12/26/2017 1430   GFRAA 93 12/26/2017 1430   Lab Results  Component Value Date   CHOL 169 05/21/2019   HDL 65 05/21/2019   LDLCALC 90 05/21/2019   TRIG 55 05/21/2019   CHOLHDL 2.6 05/21/2019   Lab Results  Component Value Date   TSH 1.290 08/18/2020      ASSESSMENT AND PLAN 53 y.o. 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 here with:  1.  Primary narcolepsy  Continues Xyrem  Recent blood work through her PCP reviewed in care everywhere Follow-up in 6 months or sooner if needed      Clem Currier, MSN, NP-C 08/01/2023, 2:55 PM [facility] [address], [address] [place], [place] [address] [phone]  The patient's condition requires frequent monitoring and adjustments in the treatment plan, reflecting the ongoing complexity of care.  This provider is the continuing focal point for all  needed services for this condition.

## 2023-08-07 ENCOUNTER — Other Ambulatory Visit: Payer: Self-pay

## 2023-08-07 NOTE — Progress Notes (Unsigned)
 Patient name: Heidi Gross MRN: [id number] DOB: September 26, 1970 Sex: female  REASON FOR CONSULT: Abdominal exposure for L4-L5 ALIF  HPI: Heidi Gross is a 53 y.o. female, with history of chronic back pain that presents for evaluation of anterior spine exposure for L4-L5 ALIF.     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    Other intervertebral disc displacement, lumbar region    Palpitations     Past Surgical History:  Procedure Laterality Date   AUGMENTATION MAMMAPLASTY     CESAREAN SECTION      Family History  Problem Relation Age of Onset   Heart disease Mother    Alcohol abuse Father    Diabetes Maternal Uncle    Arthritis Maternal Grandmother    Cancer Maternal Grandfather     SOCIAL HISTORY: Social History   Socioeconomic History   Marital status: Married    Spouse name: Not on file   Number of children: Not on file   Years of education: Not on file   Highest education level: Not on file  Occupational History   Not on file  Tobacco Use   Smoking status: Former   Smokeless tobacco: Never  Vaping Use   Vaping status: Never Used  Substance and Sexual Activity   Alcohol use: Yes    Alcohol/week: 2.0 standard drinks of alcohol    Types: 2 Glasses of wine per week   Drug use: No   Sexual activity: Yes  Other Topics Concern   Not on file  Social History Narrative   Not on file   Social Drivers of Health   Financial Resource Strain: Low Risk  (11/05/2022)   Received from [facility]   Overall Financial Resource Strain (CARDIA)    Difficulty of Paying Living Expenses: Not hard at all  Food Insecurity: No Food Insecurity (11/05/2022)   Received from [facility]   Hunger Vital Sign    Within the past 12 months, you worried that your food would run out before you  got the money to buy more.: Never true    Within the past 12 months, the food you bought just didn't last and you didn't have money to get more.: Never true  Transportation Needs: No Transportation Needs (11/05/2022)   Received from [facility] - Transportation    Lack of Transportation (Medical): No    Lack of Transportation (Non-Medical): No  Physical Activity: Sufficiently Active (11/05/2022)   Received from [facility]   Exercise Vital Sign    On average, how many days per week do you engage in moderate to strenuous exercise (like a brisk walk)?: 5 days    On average, how many minutes do you engage in exercise at this level?: 90 min  Stress: Stress Concern Present (11/05/2022)   Received from [facility] - Occupational Stress Questionnaire    Feeling of Stress : To some extent  Social Connections: Socially Integrated (11/05/2022)   Received from [facility]   Social Network    How would you rate your social network (family, work, friends)?: Good participation with social networks  Intimate Partner Violence: Not At Risk (11/05/2022)   Received from [facility]   HITS    Over the last 12  months how often did your partner physically hurt you?: Never    Over the last 12 months how often did your partner insult you or talk down to you?: Never    Over the last 12 months how often did your partner threaten you with physical harm?: Never    Over the last 12 months how often did your partner scream or curse at you?: Never    Allergies  Allergen Reactions   Sulfa Antibiotics Other (See Comments) and Dermatitis   Misc. Sulfonamide Containing Compounds Dermatitis    Current Outpatient Medications  Medication Sig Dispense Refill   azithromycin (ZITHROMAX) 250 MG tablet Take by mouth.     cephALEXin  (KEFLEX ) 250 MG capsule Take 250 mg by mouth.     Cephalexin  250 MG tablet      clobetasol (TEMOVATE) 0.05 % external solution  SMARTSIG:1 Application Topical 6 Times a Week     DULoxetine  (CYMBALTA ) 60 MG capsule      estradiol (ESTRACE) 1 MG tablet Take 1 mg by mouth daily.     folic acid (FOLVITE) 1 MG tablet      gabapentin (NEURONTIN) 100 MG capsule 2 capsules daily x 2 weeks then 3 capsules daily x 2 weeks then follow-up with rheumatologist     hydroxychloroquine (PLAQUENIL) 200 MG tablet Take 200 mg by mouth daily.     ketoconazole (NIZORAL) 2 % shampoo Apply topically.     metoprolol succinate (TOPROL-XL) 25 MG 24 hr tablet Take 25 mg by mouth daily.     predniSONE (DELTASONE) 10 MG tablet      progesterone (PROMETRIUM) 100 MG capsule      Sodium Oxybate  (XYREM ) 500 MG/ML SOLN 3.75 g twice nightly 180 mL 5   UNABLE TO FIND Take by mouth every evening. Vitamin Pack     No current facility-administered medications for this visit.    REVIEW OF SYSTEMS:  [X]  denotes positive finding, [ ]  denotes negative finding Cardiac  Comments:  Chest pain or chest pressure: ***   Shortness of breath upon exertion:    Short of breath when lying flat:    Irregular heart rhythm:        Vascular    Pain in calf, thigh, or hip brought on by ambulation:    Pain in feet at night that wakes you up from your sleep:     Blood clot in your veins:    Leg swelling:         Pulmonary    Oxygen at home:    Productive cough:     Wheezing:         Neurologic    Sudden weakness in arms or legs:     Sudden numbness in arms or legs:     Sudden onset of difficulty speaking or slurred speech:    Temporary loss of vision in one eye:     Problems with dizziness:         Gastrointestinal    Blood in stool:     Vomited blood:         Genitourinary    Burning when urinating:     Blood in urine:        Psychiatric    Major depression:         Hematologic    Bleeding problems:    Problems with blood clotting too easily:        Skin    Rashes or ulcers:  Constitutional    Fever or chills:      PHYSICAL  EXAM: There were no vitals filed for this visit.  GENERAL: The patient is a well-nourished female, in no acute distress. The vital signs are documented above. CARDIAC: There is a regular rate and rhythm.  VASCULAR: *** PULMONARY: There is good air exchange bilaterally without wheezing or rales. ABDOMEN: Soft and non-tender with normal pitched bowel sounds.  MUSCULOSKELETAL: There are no major deformities or cyanosis. NEUROLOGIC: No focal weakness or paresthesias are detected. SKIN: There are no ulcers or rashes noted. PSYCHIATRIC: The patient has a normal affect.  DATA:   ***  Assessment/Plan:  53 y.o. female, with history of chronic back pain that presents for evaluation of anterior spine exposure for L4-L5 ALIF.   Lonni DOROTHA Gaskins, MD [facility] Office: [phone]

## 2023-08-08 ENCOUNTER — Encounter: Payer: Self-pay | Admitting: Vascular Surgery

## 2023-08-08 ENCOUNTER — Ambulatory Visit: Attending: Vascular Surgery | Admitting: Vascular Surgery

## 2023-08-08 ENCOUNTER — Other Ambulatory Visit: Payer: Self-pay

## 2023-08-08 VITALS — BP 111/74 | HR 67 | Temp 98.1°F | Resp 16 | Ht 63.0 in | Wt 114.9 lb

## 2023-08-08 DIAGNOSIS — M5416 Radiculopathy, lumbar region: Secondary | ICD-10-CM

## 2023-08-08 DIAGNOSIS — Q269 Congenital malformation of great vein, unspecified: Secondary | ICD-10-CM

## 2023-08-14 ENCOUNTER — Ambulatory Visit
Admission: RE | Admit: 2023-08-14 | Discharge: 2023-08-14 | Disposition: A | Discharge status: Home / Self Care | Source: Ambulatory Visit | Attending: Vascular Surgery

## 2023-08-14 DIAGNOSIS — Q269 Congenital malformation of great vein, unspecified: Secondary | ICD-10-CM

## 2023-08-14 MED ORDER — IOPAMIDOL (ISOVUE-370) INJECTION 76%
100.0000 mL | Freq: Once | INTRAVENOUS | Status: AC | PRN
  Administered 2023-08-14: 100 mL via INTRAVENOUS

## 2023-08-14 NOTE — Pre-Procedure Instructions (Signed)
 Surgical Instructions   Your procedure is scheduled on August 21, 2023. Report to [facility] Main Entrance A at 5:30 A.M., then check in with the Admitting office. Any questions or running late day of surgery: call [phone]  Questions prior to your surgery date: call [phone], Monday-Friday, 8am-4pm. If you experience any cold or flu symptoms such as cough, fever, chills, shortness of breath, etc. between now and your scheduled surgery, please notify us  at the above number.     Remember:  Do not eat or drink after midnight the night before your surgery   Take these medicines the morning of surgery with A SIP OF WATER: cephALEXin  (KEFLEX )  DULoxetine  (CYMBALTA )  estradiol (ESTRACE)  metoprolol succinate (TOPROL-XL)    Please contact your prescribing provider for instructions regarding your hydroxychloroquine (PLAQUENIL).   One week prior to surgery, STOP taking any Aspirin (unless otherwise instructed by your surgeon) Aleve, Naproxen, Ibuprofen, Motrin, Advil, Goody's, BC's, all herbal medications, fish oil, and non-prescription vitamins.                     Do NOT Smoke (Tobacco/Vaping) for 24 hours prior to your procedure.  If you use a CPAP at night, you may bring your mask/headgear for your overnight stay.   You will be asked to remove any contacts, glasses, piercing's, hearing aid's, dentures/partials prior to surgery. Please bring cases for these items if needed.    Patients discharged the day of surgery will not be allowed to drive home, and someone needs to stay with them for 24 hours.  SURGICAL WAITING ROOM VISITATION Patients may have no more than 2 support people in the waiting area - these visitors may rotate.   Pre-op nurse will coordinate an appropriate time for 1 ADULT support person, who may not rotate, to accompany patient in pre-op.  Children under the age of 91 must have an adult with them who is not the patient and must remain in the main waiting area  with an adult.  If the patient needs to stay at the hospital during part of their recovery, the visitor guidelines for inpatient rooms apply.  Please refer to the [facility] website for the visitor guidelines for any additional information.   If you received a COVID test during your pre-op visit  it is requested that you wear a mask when out in public, stay away from anyone that may not be feeling well and notify your surgeon if you develop symptoms. If you have been in contact with anyone that has tested positive in the last 10 days please notify you surgeon.      Pre-operative 5 CHG Bathing Instructions   You can play a key role in reducing the risk of infection after surgery. Your skin needs to be as free of germs as possible. You can reduce the number of germs on your skin by washing with CHG (chlorhexidine gluconate) soap before surgery. CHG is an antiseptic soap that kills germs and continues to kill germs even after washing.   DO NOT use if you have an allergy to chlorhexidine/CHG or antibacterial soaps. If your skin becomes reddened or irritated, stop using the CHG and notify one of our RNs at [phone].   Please shower with the CHG soap starting 4 days before surgery using the following schedule:     Please keep in mind the following:  DO NOT shave, including legs and underarms, starting the day of your first shower.   You may  shave your face at any point before/day of surgery.  Place clean sheets on your bed the day you start using CHG soap. Use a clean washcloth (not used since being washed) for each shower. DO NOT sleep with pets once you start using the CHG.   CHG Shower Instructions:  Wash your face and private area with normal soap. If you choose to wash your hair, wash first with your normal shampoo.  After you use shampoo/soap, rinse your hair and body thoroughly to remove shampoo/soap residue.  Turn the water OFF and apply about 3 tablespoons (45 ml) of CHG soap  to a CLEAN washcloth.  Apply CHG soap ONLY FROM YOUR NECK DOWN TO YOUR TOES (washing for 3-5 minutes)  DO NOT use CHG soap on face, private areas, open wounds, or sores.  Pay special attention to the area where your surgery is being performed.  If you are having back surgery, having someone wash your back for you may be helpful. Wait 2 minutes after CHG soap is applied, then you may rinse off the CHG soap.  Pat dry with a clean towel  Put on clean clothes/pajamas   If you choose to wear lotion, please use ONLY the CHG-compatible lotions that are listed below.  Additional instructions for the day of surgery: DO NOT APPLY any lotions, deodorants, cologne, or perfumes.   Do not bring valuables to the hospital. [facility] is not responsible for any belongings/valuables. Do not wear nail polish, gel polish, artificial nails, or any other type of covering on natural nails (fingers and toes) Do not wear jewelry or makeup Put on clean/comfortable clothes.  Please brush your teeth.  Ask your nurse before applying any prescription medications to the skin.     CHG Compatible Lotions   Aveeno Moisturizing lotion  Cetaphil Moisturizing Cream  Cetaphil Moisturizing Lotion  Clairol Herbal Essence Moisturizing Lotion, Dry Skin  Clairol Herbal Essence Moisturizing Lotion, Extra Dry Skin  Clairol Herbal Essence Moisturizing Lotion, Normal Skin  Curel Age Defying Therapeutic Moisturizing Lotion with Alpha Hydroxy  Curel Extreme Care Body Lotion  Curel Soothing Hands Moisturizing Hand Lotion  Curel Therapeutic Moisturizing Cream, Fragrance-Free  Curel Therapeutic Moisturizing Lotion, Fragrance-Free  Curel Therapeutic Moisturizing Lotion, Original Formula  Eucerin Daily Replenishing Lotion  Eucerin Dry Skin Therapy Plus Alpha Hydroxy Crme  Eucerin Dry Skin Therapy Plus Alpha Hydroxy Lotion  Eucerin Original Crme  Eucerin Original Lotion  Eucerin Plus Crme Eucerin Plus Lotion  Eucerin  TriLipid Replenishing Lotion  Keri Anti-Bacterial Hand Lotion  Keri Deep Conditioning Original Lotion Dry Skin Formula Softly Scented  Keri Deep Conditioning Original Lotion, Fragrance Free Sensitive Skin Formula  Keri Lotion Fast Absorbing Fragrance Free Sensitive Skin Formula  Keri Lotion Fast Absorbing Softly Scented Dry Skin Formula  Keri Original Lotion  Keri Skin Renewal Lotion Keri Silky Smooth Lotion  Keri Silky Smooth Sensitive Skin Lotion  Nivea Body Creamy Conditioning Oil  Nivea Body Extra Enriched Lotion  Nivea Body Original Lotion  Nivea Body Sheer Moisturizing Lotion Nivea Crme  Nivea Skin Firming Lotion  NutraDerm 30 Skin Lotion  NutraDerm Skin Lotion  NutraDerm Therapeutic Skin Cream  NutraDerm Therapeutic Skin Lotion  ProShield Protective Hand Cream  Provon moisturizing lotion  Please read over the following fact sheets that you were given.

## 2023-08-15 ENCOUNTER — Encounter (HOSPITAL_COMMUNITY)
Admission: RE | Admit: 2023-08-15 | Discharge: 2023-08-15 | Disposition: A | Discharge status: Home / Self Care | Source: Ambulatory Visit | Attending: Neurological Surgery | Admitting: Neurological Surgery

## 2023-08-15 ENCOUNTER — Other Ambulatory Visit: Payer: Self-pay

## 2023-08-15 ENCOUNTER — Encounter (HOSPITAL_COMMUNITY): Payer: Self-pay

## 2023-08-15 VITALS — BP 120/83 | HR 57 | Temp 98.4°F | Resp 16 | Ht 63.0 in | Wt 115.6 lb

## 2023-08-15 DIAGNOSIS — G47419 Narcolepsy without cataplexy: Secondary | ICD-10-CM | POA: Diagnosis not present

## 2023-08-15 DIAGNOSIS — Z79899 Other long term (current) drug therapy: Secondary | ICD-10-CM | POA: Insufficient documentation

## 2023-08-15 DIAGNOSIS — M069 Rheumatoid arthritis, unspecified: Secondary | ICD-10-CM | POA: Diagnosis not present

## 2023-08-15 DIAGNOSIS — I471 Supraventricular tachycardia, unspecified: Secondary | ICD-10-CM | POA: Insufficient documentation

## 2023-08-15 DIAGNOSIS — I493 Ventricular premature depolarization: Secondary | ICD-10-CM | POA: Diagnosis not present

## 2023-08-15 DIAGNOSIS — Z01818 Encounter for other preprocedural examination: Secondary | ICD-10-CM

## 2023-08-15 DIAGNOSIS — Z01812 Encounter for preprocedural laboratory examination: Secondary | ICD-10-CM | POA: Insufficient documentation

## 2023-08-15 HISTORY — DX: Cardiac arrhythmia, unspecified: I49.9

## 2023-08-15 HISTORY — DX: Unspecified osteoarthritis, unspecified site: M19.90

## 2023-08-15 LAB — CBC
HCT: 41.1 % (ref 36.0–46.0)
Hemoglobin: 13.7 g/dL (ref 12.0–15.0)
MCH: 31.2 pg (ref 26.0–34.0)
MCHC: 33.3 g/dL (ref 30.0–36.0)
MCV: 93.6 fL (ref 80.0–100.0)
Platelets: 187 10*3/uL (ref 150–400)
RBC: 4.39 MIL/uL (ref 3.87–5.11)
RDW: 11.7 % (ref 11.5–15.5)
WBC: 5.1 10*3/uL (ref 4.0–10.5)
nRBC: 0 % (ref 0.0–0.2)

## 2023-08-15 LAB — BASIC METABOLIC PANEL WITH GFR
Anion gap: 9 (ref 5–15)
BUN: 16 mg/dL (ref 6–20)
CO2: 28 mmol/L (ref 22–32)
Calcium: 9.8 mg/dL (ref 8.9–10.3)
Chloride: 99 mmol/L (ref 98–111)
Creatinine, Ser: 0.9 mg/dL (ref 0.44–1.00)
GFR, Estimated: >60 mL/min (ref >60)
Glucose, Bld: 98 mg/dL (ref 70–99)
Potassium: 4.2 mmol/L (ref 3.5–5.1)
Sodium: 136 mmol/L (ref 135–145)

## 2023-08-15 LAB — TYPE AND SCREEN
ABO/RH(D): O POS
Antibody Screen: NEGATIVE

## 2023-08-15 LAB — SURGICAL PCR SCREEN
MRSA, PCR: NEGATIVE
Staphylococcus aureus: NEGATIVE

## 2023-08-15 NOTE — Progress Notes (Signed)
 PCP - Tylene Meres, PA-C Cardiologist - Dr. Marcello Lennox - last office visit 04/04/2023  PPM/ICD - Denies Device Orders - n/a Rep Notified - n/a  Chest x-ray - n/a EKG - 12/02/2022 (CE) tracing requested Stress Test - 12/12/2022 (CE) ECHO - 12/12/2022 (CE) Cardiac Cath - Denies  Sleep Study - Denies CPAP - n/a  No DM  Last dose of GLP1 agonist- n/a GLP1 instructions: n/a  Blood Thinner Instructions: n/a Aspirin Instructions: n/a  NPO after midnight   COVID TEST- No   Anesthesia review: Yes. EKG tracing requested. Pt is on Keflex  for recurrent UTIs and takes it every night.  Patient denies shortness of breath, fever, cough and chest pain at PAT appointment. Pt denies any respiratory illness/infection in the last two months.    All instructions explained to the patient, with a verbal understanding of the material. Patient agrees to go over the instructions while at home for a better understanding. Patient also instructed to self quarantine after being tested for COVID-19. The opportunity to ask questions was provided.

## 2023-08-16 NOTE — Progress Notes (Signed)
 Anesthesia Chart Review:  53 year old female follows with cardiologist Dr. Uvaldo at [facility] for history of NSVT.  Last seen 04/04/2023 was noted to be doing well at that time.  Per note, Cardiac wise, she seems to be doing very well. Symptoms of palpitations due to nonsustained SVT/inappropriate sinus tachycardia/rare PVCs are well-controlled on Toprol-XL 25 mg a day. She exercises on routine basis with no exertional symptoms. Blood pressure is controlled. Cholesterol level is at goal. I reviewed Holter monitor finding and result from a stress echocardiogram which were of low risk features. I will follow-up with her in a year.  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  Preop labs reviewed, WNL.  EKG 12/02/2022 (Care Everywhere, tracing requested): Sinus rhythm.  Monitor 12/29/2022 The patient was monitored for a total of 13d 23h, underlying rhythm is Sinus. The minimum heart rate was 45 bpm; the maximum 178 bpm; the average 84 bpm.  No atrial flutter/fibrillation, no evidence of high-grade AV block or pathological pauses.  19 supraventricular episodes were found. Longest SVT Episode 16 beats, Fastest SVT 180 bpm There were a total of 2161 PVCs with 3 morphologies and 46 couplets. Overall PVC Burden at 0.13 % There were a total of 0 Other Beats. There were 0 total number of paced beats.  There were a total of 1238 PSVCs with 25 couplets. Overall PSVC Burden at 0.07 %  There is a total of 20 patient events-mostly associated with sinus tachycardia.    Stress echocardiogram 12/12/2022   Stress: The patient underwent standard stress protocol. Total exercise time was 5 minutes. Estimated METs = 9.5. There was an appropriate heart rate response to exercise. The patient achieved a heart rate of 153 beats per minute at maximum stress, 91% of the maximal age-predicted heart rate. The patient experienced no other symptoms during the stress test.   Baseline  EKG shows sinus rhythm, no ischemia, no ectopies.  Mild nonspecific expected ST changes with exercise.  No angina or equivalent reported.   Baseline echocardiogram shows normal biventricular size and function, LV ejection fraction of 55 to 60%, no major valvular abnormalities.   Stress echocardiogram shows appropriate reduction in the LV cavity size with  augmentation of LV function, no regional wall motion abnormalities within the limit of study.   Conclusion: Stress test is negative for demonstration of inducible ischemia by EKG, echocardiogram and clinical criteria.     Lynwood Geofm RIGGERS [facility] Short Stay Center/Anesthesiology Phone [phone] 08/16/2023 12:21 PM

## 2023-08-16 NOTE — Anesthesia Preprocedure Evaluation (Addendum)
 Anesthesia Evaluation  Patient identified by MRN, date of birth, ID band Patient awake    Reviewed: Allergy & Precautions, NPO status , Patient's Chart, lab work & pertinent test results  Airway Mallampati: II  TM Distance: >3 FB Neck ROM: Full    Dental no notable dental hx.    Pulmonary neg pulmonary ROS, former smoker   Pulmonary exam normal        Cardiovascular hypertension, Pt. on medications and Pt. on home beta blockers + dysrhythmias  Rhythm:Regular Rate:Normal     Neuro/Psych  Headaches  Anxiety Depression       GI/Hepatic negative GI ROS, Neg liver ROS,,,  Endo/Other    Renal/GU negative Renal ROS  negative genitourinary   Musculoskeletal  (+) Arthritis ,  Fibromyalgia -  Abdominal Normal abdominal exam  (+)   Peds  Hematology Lab Results      Component                Value               Date                      WBC                      5.1                 08/15/2023                HGB                      13.7                08/15/2023                HCT                      41.1                08/15/2023                MCV                      93.6                08/15/2023                PLT                      187                 08/15/2023             Lab Results      Component                Value               Date                      NA                       136                 08/15/2023                K  4.2                 08/15/2023                CO2                      28                  08/15/2023                GLUCOSE                  98                  08/15/2023                BUN                      16                  08/15/2023                CREATININE               0.90                08/15/2023                CALCIUM                  9.8                 08/15/2023                EGFR                     66                  08/18/2020                 GFRNONAA                 >60                 08/15/2023              Anesthesia Other Findings   Reproductive/Obstetrics                              Anesthesia Physical Anesthesia Plan  ASA: 3  Anesthesia Plan: General   Post-op Pain Management:    Induction: Intravenous  PONV Risk Score and Plan: 3 and Ondansetron , Dexamethasone , Midazolam  and Treatment may vary due to age or medical condition  Airway Management Planned: Mask and Oral ETT  Additional Equipment: None  Intra-op Plan:   Post-operative Plan: Extubation in OR  Informed Consent: I have reviewed the patients History and Physical, chart, labs and discussed the procedure including the risks, benefits and alternatives for the proposed anesthesia with the patient or authorized representative who has indicated his/her understanding and acceptance.     Dental advisory given  Plan Discussed with: CRNA  Anesthesia Plan Comments: (PAT note by Lynwood Hope, PA-C:  53 year old female follows with cardiologist Dr. Uvaldo at [facility] for history of NSVT.  Last seen 04/04/2023 was noted to be doing well at that time.  Per note, Cardiac wise, she seems to be doing very well. Symptoms  of palpitations due to nonsustained SVT/inappropriate sinus tachycardia/rare PVCs are well-controlled on Toprol -XL 25 mg a day. She exercises on routine basis with no exertional symptoms. Blood pressure is controlled. Cholesterol level is at goal. I reviewed Holter monitor finding and result from a stress echocardiogram which were of low risk features. I will follow-up with her in a year.  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  Preop labs reviewed, WNL.  EKG 12/02/2022 (Care Everywhere, tracing requested): Sinus rhythm.  Monitor 12/29/2022 The patient was monitored for a total of 13d 23h, underlying rhythm is Sinus. The minimum heart rate was 45 bpm; the maximum 178 bpm;  the average 84 bpm.  No atrial flutter/fibrillation, no evidence of high-grade AV block or pathological pauses.  19 supraventricular episodes were found. Longest SVT Episode 16 beats, Fastest SVT 180 bpm There were a total of 2161 PVCs with 3 morphologies and 46 couplets. Overall PVC Burden at 0.13 % There were a total of 0 Other Beats. There were 0 total number of paced beats.  There were a total of 1238 PSVCs with 25 couplets. Overall PSVC Burden at 0.07 %  There is a total of 20 patient events-mostly associated with sinus tachycardia.   Stress echocardiogram 12/12/2022   Stress: The patient underwent standard stress protocol. Total exercise time was 5 minutes. Estimated METs = 9.5. There was an appropriate heart rate response to exercise. The patient achieved a heart rate of 153 beats per minute at maximum stress, 91% of the maximal age-predicted heart rate. The patient experienced no other symptoms during the stress test.   Baseline EKG shows sinus rhythm, no ischemia, no ectopies.  Mild nonspecific expected ST changes with exercise.  No angina or equivalent reported.   Baseline echocardiogram shows normal biventricular size and function, LV ejection fraction of 55 to 60%, no major valvular abnormalities.   Stress echocardiogram shows appropriate reduction in the LV cavity size with  augmentation of LV function, no regional wall motion abnormalities within the limit of study.   Conclusion: Stress test is negative for demonstration of inducible ischemia by EKG, echocardiogram and clinical criteria.   )         Anesthesia Quick Evaluation

## 2023-08-21 ENCOUNTER — Inpatient Hospital Stay (HOSPITAL_COMMUNITY)

## 2023-08-21 ENCOUNTER — Encounter (HOSPITAL_COMMUNITY)
Admission: RE | Disposition: A | Discharge status: Home / Self Care | Payer: Self-pay | Source: Home / Self Care | Attending: Neurological Surgery

## 2023-08-21 ENCOUNTER — Other Ambulatory Visit: Payer: Self-pay

## 2023-08-21 ENCOUNTER — Inpatient Hospital Stay (HOSPITAL_COMMUNITY): Payer: Self-pay | Admitting: Physician Assistant

## 2023-08-21 ENCOUNTER — Encounter (HOSPITAL_COMMUNITY): Payer: Self-pay | Admitting: Neurological Surgery

## 2023-08-21 ENCOUNTER — Inpatient Hospital Stay (HOSPITAL_COMMUNITY)
Admission: RE | Admit: 2023-08-21 | Discharge: 2023-08-22 | DRG: 451 | Disposition: A | Discharge status: Home / Self Care | Attending: Neurological Surgery | Admitting: Neurological Surgery

## 2023-08-21 DIAGNOSIS — N3020 Other chronic cystitis without hematuria: Secondary | ICD-10-CM | POA: Diagnosis present

## 2023-08-21 DIAGNOSIS — M51369 Other intervertebral disc degeneration, lumbar region without mention of lumbar back pain or lower extremity pain: Principal | ICD-10-CM | POA: Diagnosis present

## 2023-08-21 DIAGNOSIS — F419 Anxiety disorder, unspecified: Secondary | ICD-10-CM | POA: Diagnosis present

## 2023-08-21 DIAGNOSIS — Z79899 Other long term (current) drug therapy: Secondary | ICD-10-CM | POA: Diagnosis not present

## 2023-08-21 DIAGNOSIS — Z87891 Personal history of nicotine dependence: Secondary | ICD-10-CM

## 2023-08-21 DIAGNOSIS — F418 Other specified anxiety disorders: Secondary | ICD-10-CM | POA: Diagnosis not present

## 2023-08-21 DIAGNOSIS — M5136 Other intervertebral disc degeneration, lumbar region with discogenic back pain only: Secondary | ICD-10-CM | POA: Diagnosis not present

## 2023-08-21 DIAGNOSIS — M5416 Radiculopathy, lumbar region: Secondary | ICD-10-CM | POA: Diagnosis present

## 2023-08-21 DIAGNOSIS — G8929 Other chronic pain: Secondary | ICD-10-CM | POA: Diagnosis present

## 2023-08-21 DIAGNOSIS — I10 Essential (primary) hypertension: Secondary | ICD-10-CM | POA: Diagnosis present

## 2023-08-21 DIAGNOSIS — M5116 Intervertebral disc disorders with radiculopathy, lumbar region: Principal | ICD-10-CM | POA: Diagnosis present

## 2023-08-21 DIAGNOSIS — M797 Fibromyalgia: Secondary | ICD-10-CM | POA: Diagnosis present

## 2023-08-21 DIAGNOSIS — Z881 Allergy status to other antibiotic agents status: Secondary | ICD-10-CM

## 2023-08-21 DIAGNOSIS — Z8249 Family history of ischemic heart disease and other diseases of the circulatory system: Secondary | ICD-10-CM | POA: Diagnosis not present

## 2023-08-21 HISTORY — PX: ANTERIOR LUMBAR FUSION: SHX1170

## 2023-08-21 HISTORY — PX: ABDOMINAL EXPOSURE: SHX5708

## 2023-08-21 LAB — CBC
HCT: 34.3 % — ABNORMAL LOW (ref 36.0–46.0)
Hemoglobin: 11.7 g/dL — ABNORMAL LOW (ref 12.0–15.0)
MCH: 32.1 pg (ref 26.0–34.0)
MCHC: 34.1 g/dL (ref 30.0–36.0)
MCV: 94.2 fL (ref 80.0–100.0)
Platelets: 133 K/uL — ABNORMAL LOW (ref 150–400)
RBC: 3.64 MIL/uL — ABNORMAL LOW (ref 3.87–5.11)
RDW: 11.8 % (ref 11.5–15.5)
WBC: 10.1 K/uL (ref 4.0–10.5)
nRBC: 0 % (ref 0.0–0.2)

## 2023-08-21 LAB — ABO/RH: ABO/RH(D): O POS

## 2023-08-21 LAB — CREATININE, SERUM
Creatinine, Ser: 0.85 mg/dL (ref 0.44–1.00)
GFR, Estimated: >60 mL/min (ref >60)

## 2023-08-21 SURGERY — ANTERIOR LUMBAR FUSION 1 LEVEL
Anesthesia: General

## 2023-08-21 MED ORDER — CHLORHEXIDINE GLUCONATE CLOTH 2 % EX PADS: 6.0000 | MEDICATED_PAD | Freq: Once | CUTANEOUS | Status: DC

## 2023-08-21 MED ORDER — METHOCARBAMOL 1000 MG/10ML IJ SOLN: 500.0000 mg | Freq: Four times a day (QID) | INTRAMUSCULAR | Status: DC | PRN

## 2023-08-21 MED ORDER — METOPROLOL SUCCINATE ER 25 MG PO TB24
25.0000 mg | ORAL_TABLET | Freq: Every day | ORAL | Status: DC
Start: 2023-08-22 — End: 2023-08-22
  Administered 2023-08-22: 25 mg via ORAL
  Filled 2023-08-21: qty 1

## 2023-08-21 MED ORDER — PHENOL 1.4 % MT LIQD: 1.0000 | OROMUCOSAL | Status: DC | PRN

## 2023-08-21 MED ORDER — BUPIVACAINE HCL (PF) 0.5 % IJ SOLN
INTRAMUSCULAR | Status: AC
  Filled 2023-08-21: qty 30

## 2023-08-21 MED ORDER — KETAMINE HCL 10 MG/ML IJ SOLN
INTRAMUSCULAR | Status: DC | PRN
Start: 2023-08-21 — End: 2023-08-21
  Administered 2023-08-21: 15 mg via INTRAVENOUS
  Administered 2023-08-21: 25 mg via INTRAVENOUS
  Administered 2023-08-21: 10 mg via INTRAVENOUS

## 2023-08-21 MED ORDER — GABAPENTIN 300 MG PO CAPS
300.0000 mg | ORAL_CAPSULE | Freq: Every evening | ORAL | Status: DC
  Administered 2023-08-21: 300 mg via ORAL
  Filled 2023-08-21: qty 1

## 2023-08-21 MED ORDER — DEXAMETHASONE SODIUM PHOSPHATE 10 MG/ML IJ SOLN
INTRAMUSCULAR | Status: AC
  Filled 2023-08-21: qty 1

## 2023-08-21 MED ORDER — DEXAMETHASONE SODIUM PHOSPHATE 10 MG/ML IJ SOLN
INTRAMUSCULAR | Status: DC | PRN
  Administered 2023-08-21: 10 mg via INTRAVENOUS

## 2023-08-21 MED ORDER — EPHEDRINE SULFATE-NACL 50-0.9 MG/10ML-% IV SOSY
PREFILLED_SYRINGE | INTRAVENOUS | Status: DC | PRN
  Administered 2023-08-21 (×3): 5 mg via INTRAVENOUS
  Administered 2023-08-21: 10 mg via INTRAVENOUS

## 2023-08-21 MED ORDER — ONDANSETRON HCL 4 MG/2ML IJ SOLN
INTRAMUSCULAR | Status: AC
  Filled 2023-08-21: qty 2

## 2023-08-21 MED ORDER — LIDOCAINE 2% (20 MG/ML) 5 ML SYRINGE
INTRAMUSCULAR | Status: DC | PRN
  Administered 2023-08-21: 100 mg via INTRAVENOUS

## 2023-08-21 MED ORDER — SUGAMMADEX SODIUM 200 MG/2ML IV SOLN
INTRAVENOUS | Status: DC | PRN
  Administered 2023-08-21 (×2): 100 mg via INTRAVENOUS

## 2023-08-21 MED ORDER — LIDOCAINE 2% (20 MG/ML) 5 ML SYRINGE
INTRAMUSCULAR | Status: AC
  Filled 2023-08-21: qty 5

## 2023-08-21 MED ORDER — PROPOFOL 10 MG/ML IV BOLUS
INTRAVENOUS | Status: DC | PRN
  Administered 2023-08-21: 180 mg via INTRAVENOUS

## 2023-08-21 MED ORDER — LACTATED RINGERS IV SOLN: INTRAVENOUS | Status: DC

## 2023-08-21 MED ORDER — CEFAZOLIN SODIUM-DEXTROSE 2-4 GM/100ML-% IV SOLN
INTRAVENOUS | Status: AC
  Filled 2023-08-21: qty 100

## 2023-08-21 MED ORDER — PHENYLEPHRINE HCL-NACL 20-0.9 MG/250ML-% IV SOLN
INTRAVENOUS | Status: DC | PRN
Start: 2023-08-21 — End: 2023-08-21

## 2023-08-21 MED ORDER — METHOCARBAMOL 500 MG PO TABS
500.0000 mg | ORAL_TABLET | Freq: Four times a day (QID) | ORAL | Status: DC | PRN
  Administered 2023-08-21 – 2023-08-22 (×3): 500 mg via ORAL
  Filled 2023-08-21 (×3): qty 1

## 2023-08-21 MED ORDER — GLYCOPYRROLATE 0.2 MG/ML IJ SOLN
INTRAMUSCULAR | Status: DC | PRN
  Administered 2023-08-21: .2 mg via INTRAVENOUS

## 2023-08-21 MED ORDER — SODIUM CHLORIDE 0.9% FLUSH
3.0000 mL | Freq: Two times a day (BID) | INTRAVENOUS | Status: DC
  Administered 2023-08-21: 3 mL via INTRAVENOUS

## 2023-08-21 MED ORDER — ACETAMINOPHEN 10 MG/ML IV SOLN
1000.0000 mg | Freq: Once | INTRAVENOUS | Status: DC | PRN
Start: 2023-08-21 — End: 2023-08-21
  Administered 2023-08-21: 1000 mg via INTRAVENOUS

## 2023-08-21 MED ORDER — ALBUMIN HUMAN 5 % IV SOLN
12.5000 g | Freq: Once | INTRAVENOUS | Status: AC
  Administered 2023-08-21: 12.5 g via INTRAVENOUS

## 2023-08-21 MED ORDER — MIDAZOLAM HCL 2 MG/2ML IJ SOLN
INTRAMUSCULAR | Status: AC
  Filled 2023-08-21: qty 2

## 2023-08-21 MED ORDER — DOCUSATE SODIUM 100 MG PO CAPS
100.0000 mg | ORAL_CAPSULE | Freq: Two times a day (BID) | ORAL | Status: DC
  Administered 2023-08-21 – 2023-08-22 (×3): 100 mg via ORAL
  Filled 2023-08-21 (×4): qty 1

## 2023-08-21 MED ORDER — POLYETHYLENE GLYCOL 3350 17 G PO PACK: 17.0000 g | PACK | Freq: Every day | ORAL | Status: DC | PRN

## 2023-08-21 MED ORDER — PHENYLEPHRINE HCL-NACL 20-0.9 MG/250ML-% IV SOLN: INTRAVENOUS | Status: DC | PRN

## 2023-08-21 MED ORDER — 0.9 % SODIUM CHLORIDE (POUR BTL) OPTIME
TOPICAL | Status: DC | PRN
  Administered 2023-08-21 (×2): 1000 mL

## 2023-08-21 MED ORDER — ORAL CARE MOUTH RINSE: 15.0000 mL | Freq: Once | OROMUCOSAL | Status: AC

## 2023-08-21 MED ORDER — ONDANSETRON HCL 4 MG/2ML IJ SOLN: 4.0000 mg | Freq: Four times a day (QID) | INTRAMUSCULAR | Status: DC | PRN

## 2023-08-21 MED ORDER — CHLORHEXIDINE GLUCONATE 0.12 % MT SOLN: 15.0000 mL | Freq: Once | OROMUCOSAL | Status: AC

## 2023-08-21 MED ORDER — PHENYLEPHRINE HCL-NACL 20-0.9 MG/250ML-% IV SOLN
INTRAVENOUS | Status: AC
  Filled 2023-08-21: qty 750

## 2023-08-21 MED ORDER — PHENYLEPHRINE 80 MCG/ML (10ML) SYRINGE FOR IV PUSH (FOR BLOOD PRESSURE SUPPORT)
PREFILLED_SYRINGE | INTRAVENOUS | Status: DC | PRN
  Administered 2023-08-21 (×2): 80 ug via INTRAVENOUS
  Administered 2023-08-21 (×2): 160 ug via INTRAVENOUS

## 2023-08-21 MED ORDER — THROMBIN 20000 UNITS EX SOLR
CUTANEOUS | Status: DC | PRN
  Administered 2023-08-21: 20 mL via TOPICAL

## 2023-08-21 MED ORDER — FLEET ENEMA RE ENEM: 1.0000 | ENEMA | Freq: Once | RECTAL | Status: DC | PRN

## 2023-08-21 MED ORDER — SODIUM CHLORIDE 0.9% FLUSH: 3.0000 mL | INTRAVENOUS | Status: DC | PRN

## 2023-08-21 MED ORDER — FENTANYL CITRATE (PF) 100 MCG/2ML IJ SOLN
25.0000 ug | INTRAMUSCULAR | Status: DC | PRN
  Administered 2023-08-21: 25 ug via INTRAVENOUS

## 2023-08-21 MED ORDER — SODIUM CHLORIDE 0.9 % IV SOLN
250.0000 mL | INTRAVENOUS | Status: DC
  Administered 2023-08-21: 250 mL via INTRAVENOUS

## 2023-08-21 MED ORDER — ESTRADIOL 0.5 MG PO TABS
1.0000 mg | ORAL_TABLET | Freq: Every day | ORAL | Status: DC
  Administered 2023-08-22: 1 mg via ORAL
  Filled 2023-08-21: qty 2

## 2023-08-21 MED ORDER — KETAMINE HCL 50 MG/5ML IJ SOSY
PREFILLED_SYRINGE | INTRAMUSCULAR | Status: AC
  Filled 2023-08-21: qty 5

## 2023-08-21 MED ORDER — HYDROXYCHLOROQUINE SULFATE 200 MG PO TABS
200.0000 mg | ORAL_TABLET | Freq: Every day | ORAL | Status: DC
  Administered 2023-08-22: 200 mg via ORAL
  Filled 2023-08-21: qty 1

## 2023-08-21 MED ORDER — PROPOFOL 10 MG/ML IV BOLUS
INTRAVENOUS | Status: AC
  Filled 2023-08-21: qty 20

## 2023-08-21 MED ORDER — OXYCODONE-ACETAMINOPHEN 5-325 MG PO TABS
1.0000 | ORAL_TABLET | Freq: Four times a day (QID) | ORAL | Status: DC | PRN
  Administered 2023-08-21: 1 via ORAL
  Filled 2023-08-21: qty 1

## 2023-08-21 MED ORDER — PHENYLEPHRINE HCL-NACL 20-0.9 MG/250ML-% IV SOLN
INTRAVENOUS | Status: DC | PRN
  Administered 2023-08-21: 30 ug/min via INTRAVENOUS

## 2023-08-21 MED ORDER — ROCURONIUM BROMIDE 10 MG/ML (PF) SYRINGE
PREFILLED_SYRINGE | INTRAVENOUS | Status: DC | PRN
  Administered 2023-08-21: 20 mg via INTRAVENOUS
  Administered 2023-08-21: 30 mg via INTRAVENOUS
  Administered 2023-08-21: 70 mg via INTRAVENOUS
  Administered 2023-08-21 (×3): 20 mg via INTRAVENOUS

## 2023-08-21 MED ORDER — CEFAZOLIN SODIUM-DEXTROSE 2-4 GM/100ML-% IV SOLN
2.0000 g | Freq: Three times a day (TID) | INTRAVENOUS | Status: AC
  Administered 2023-08-21 (×2): 2 g via INTRAVENOUS
  Filled 2023-08-21 (×2): qty 100

## 2023-08-21 MED ORDER — ONDANSETRON HCL 4 MG/2ML IJ SOLN
INTRAMUSCULAR | Status: DC | PRN
  Administered 2023-08-21: 4 mg via INTRAVENOUS

## 2023-08-21 MED ORDER — ALBUMIN HUMAN 5 % IV SOLN: INTRAVENOUS | Status: DC | PRN

## 2023-08-21 MED ORDER — ALBUMIN HUMAN 5 % IV SOLN
INTRAVENOUS | Status: AC
  Filled 2023-08-21: qty 250

## 2023-08-21 MED ORDER — FENTANYL CITRATE (PF) 100 MCG/2ML IJ SOLN
INTRAMUSCULAR | Status: AC
  Filled 2023-08-21: qty 2

## 2023-08-21 MED ORDER — ROCURONIUM BROMIDE 10 MG/ML (PF) SYRINGE
PREFILLED_SYRINGE | INTRAVENOUS | Status: AC
  Filled 2023-08-21: qty 10

## 2023-08-21 MED ORDER — LIDOCAINE-EPINEPHRINE 1 %-1:100000 IJ SOLN
INTRAMUSCULAR | Status: AC
Start: 2023-08-21 — End: 2023-08-21
  Filled 2023-08-21: qty 1

## 2023-08-21 MED ORDER — FENTANYL CITRATE (PF) 250 MCG/5ML IJ SOLN
INTRAMUSCULAR | Status: AC
  Filled 2023-08-21: qty 5

## 2023-08-21 MED ORDER — MENTHOL 3 MG MT LOZG: 1.0000 | LOZENGE | OROMUCOSAL | Status: DC | PRN

## 2023-08-21 MED ORDER — ACETAMINOPHEN 325 MG PO TABS: 650.0000 mg | ORAL_TABLET | ORAL | Status: DC | PRN

## 2023-08-21 MED ORDER — THROMBIN 20000 UNITS EX SOLR
CUTANEOUS | Status: AC
  Filled 2023-08-21: qty 20000

## 2023-08-21 MED ORDER — ACETAMINOPHEN 10 MG/ML IV SOLN
INTRAVENOUS | Status: AC
  Filled 2023-08-21: qty 100

## 2023-08-21 MED ORDER — BISACODYL 10 MG RE SUPP: 10.0000 mg | Freq: Every day | RECTAL | Status: DC | PRN

## 2023-08-21 MED ORDER — OXYCODONE-ACETAMINOPHEN 5-325 MG PO TABS
1.0000 | ORAL_TABLET | ORAL | Status: DC | PRN
  Administered 2023-08-21 (×2): 1 via ORAL
  Administered 2023-08-22: 2 via ORAL
  Administered 2023-08-22: 1 via ORAL
  Filled 2023-08-21: qty 1
  Filled 2023-08-21: qty 2
  Filled 2023-08-21: qty 1
  Filled 2023-08-21: qty 2

## 2023-08-21 MED ORDER — SENNA 8.6 MG PO TABS
1.0000 | ORAL_TABLET | Freq: Two times a day (BID) | ORAL | Status: DC
  Administered 2023-08-21 – 2023-08-22 (×3): 8.6 mg via ORAL
  Filled 2023-08-21 (×3): qty 1

## 2023-08-21 MED ORDER — BUPIVACAINE HCL (PF) 0.5 % IJ SOLN
INTRAMUSCULAR | Status: DC | PRN
  Administered 2023-08-21: 5 mL
  Administered 2023-08-21: 10 mL

## 2023-08-21 MED ORDER — CEFAZOLIN SODIUM-DEXTROSE 2-4 GM/100ML-% IV SOLN
2.0000 g | INTRAVENOUS | Status: AC
  Administered 2023-08-21: 2 g via INTRAVENOUS

## 2023-08-21 MED ORDER — HYDROMORPHONE HCL 1 MG/ML IJ SOLN: 0.5000 mg | INTRAMUSCULAR | Status: DC | PRN

## 2023-08-21 MED ORDER — THROMBIN 5000 UNITS EX KIT
PACK | CUTANEOUS | Status: AC
  Filled 2023-08-21: qty 1

## 2023-08-21 MED ORDER — ONDANSETRON HCL 4 MG PO TABS: 4.0000 mg | ORAL_TABLET | Freq: Four times a day (QID) | ORAL | Status: DC | PRN

## 2023-08-21 MED ORDER — CHLORHEXIDINE GLUCONATE 0.12 % MT SOLN
OROMUCOSAL | Status: AC
  Administered 2023-08-21: 15 mL via OROMUCOSAL
  Filled 2023-08-21: qty 15

## 2023-08-21 MED ORDER — THROMBIN 5000 UNITS EX SOLR
OROMUCOSAL | Status: DC | PRN
  Administered 2023-08-21: 5 mL via TOPICAL

## 2023-08-21 MED ORDER — ENOXAPARIN SODIUM 40 MG/0.4ML IJ SOSY
40.0000 mg | PREFILLED_SYRINGE | INTRAMUSCULAR | Status: DC
  Administered 2023-08-22: 40 mg via SUBCUTANEOUS
  Filled 2023-08-21: qty 0.4

## 2023-08-21 MED ORDER — PROGESTERONE MICRONIZED 100 MG PO CAPS
100.0000 mg | ORAL_CAPSULE | Freq: Every evening | ORAL | Status: DC
  Administered 2023-08-21: 100 mg via ORAL
  Filled 2023-08-21 (×2): qty 1

## 2023-08-21 MED ORDER — FENTANYL CITRATE (PF) 250 MCG/5ML IJ SOLN
INTRAMUSCULAR | Status: DC | PRN
  Administered 2023-08-21 (×2): 50 ug via INTRAVENOUS

## 2023-08-21 MED ORDER — DULOXETINE HCL 60 MG PO CPEP
60.0000 mg | ORAL_CAPSULE | Freq: Two times a day (BID) | ORAL | Status: DC
Start: 2023-08-21 — End: 2023-08-22
  Administered 2023-08-21 – 2023-08-22 (×2): 60 mg via ORAL
  Filled 2023-08-21 (×2): qty 1

## 2023-08-21 MED ORDER — MIDAZOLAM HCL 2 MG/2ML IJ SOLN
INTRAMUSCULAR | Status: DC | PRN
  Administered 2023-08-21: 2 mg via INTRAVENOUS

## 2023-08-21 MED ORDER — ACETAMINOPHEN 650 MG RE SUPP: 650.0000 mg | RECTAL | Status: DC | PRN

## 2023-08-21 MED ORDER — LACTATED RINGERS IV SOLN: INTRAVENOUS | Status: DC | PRN

## 2023-08-21 MED ORDER — LIDOCAINE-EPINEPHRINE 1 %-1:100000 IJ SOLN
INTRAMUSCULAR | Status: DC | PRN
  Administered 2023-08-21: 5 mL

## 2023-08-21 SURGICAL SUPPLY — 69 items
BAG COUNTER SPONGE SURGICOUNT (BAG) ×2 IMPLANT
BASKET BONE COLLECTION (BASKET) IMPLANT
BOLT ALIF MODULUS 5X20 (Bolt) IMPLANT
BONE MATRIX OSTEOCEL PRO LRG (Bone Implant) IMPLANT
BUR BARREL STRAIGHT FLUTE 4.0 (BURR) ×1 IMPLANT
BUR MATCHSTICK NEURO 3.0 LAGG (BURR) IMPLANT
CANISTER SUCTION 3000ML PPV (SUCTIONS) ×1 IMPLANT
CLIP APPLIE 11 MED OPEN (CLIP) ×1 IMPLANT
CLIP LIGATING EXTRA MED SLVR (CLIP) IMPLANT
COVER BACK TABLE 60X90IN (DRAPES) ×1 IMPLANT
DERMABOND ADVANCED .7 DNX12 (GAUZE/BANDAGES/DRESSINGS) ×1 IMPLANT
DRAPE C-ARM 42X72 X-RAY (DRAPES) ×1 IMPLANT
DRAPE C-ARMOR (DRAPES) ×1 IMPLANT
DRAPE LAPAROTOMY 100X72X124 (DRAPES) ×1 IMPLANT
DURAPREP 26ML APPLICATOR (WOUND CARE) ×1 IMPLANT
ELECTRODE BLDE 4.0 EZ CLN MEGD (MISCELLANEOUS) ×2 IMPLANT
ELECTRODE REM PT RTRN 9FT ADLT (ELECTROSURGICAL) ×1 IMPLANT
GAUZE 4X4 16PLY ~~LOC~~+RFID DBL (SPONGE) IMPLANT
GLOVE BIO SURGEON STRL SZ7.5 (GLOVE) ×1 IMPLANT
GLOVE BIOGEL PI IND STRL 7.5 (GLOVE) IMPLANT
GLOVE BIOGEL PI IND STRL 8 (GLOVE) ×1 IMPLANT
GLOVE BIOGEL PI IND STRL 8.5 (GLOVE) ×1 IMPLANT
GLOVE ECLIPSE 8.5 STRL (GLOVE) ×1 IMPLANT
GLOVE SKINSENSE STRL SZ7.0 (GLOVE) IMPLANT
GOWN STRL REUS W/ TWL LRG LVL3 (GOWN DISPOSABLE) ×1 IMPLANT
GOWN STRL REUS W/ TWL XL LVL3 (GOWN DISPOSABLE) ×2 IMPLANT
GOWN STRL REUS W/TWL 2XL LVL3 (GOWN DISPOSABLE) ×1 IMPLANT
GRAFT BONE PROTEIOS SM 1CC (Orthopedic Implant) IMPLANT
HEMOSTAT POWDER KIT SURGIFOAM (HEMOSTASIS) ×1 IMPLANT
INSERT FOGARTY 61MM (MISCELLANEOUS) IMPLANT
INSERT FOGARTY SM (MISCELLANEOUS) IMPLANT
KIT BASIN OR (CUSTOM PROCEDURE TRAY) ×1 IMPLANT
KIT TURNOVER KIT B (KITS) ×1 IMPLANT
NDL HYPO 25X1 1.5 SAFETY (NEEDLE) IMPLANT
NDL SPNL 18GX3.5 QUINCKE PK (NEEDLE) IMPLANT
NEEDLE HYPO 25X1 1.5 SAFETY (NEEDLE) IMPLANT
NEEDLE SPNL 18GX3.5 QUINCKE PK (NEEDLE) ×1 IMPLANT
NS IRRIG 1000ML POUR BTL (IV SOLUTION) ×1 IMPLANT
PACK LAMINECTOMY NEURO (CUSTOM PROCEDURE TRAY) ×1 IMPLANT
PAD ARMBOARD POSITIONER FOAM (MISCELLANEOUS) ×2 IMPLANT
PATTIES SURGICAL .5 X.5 (GAUZE/BANDAGES/DRESSINGS) ×1 IMPLANT
PATTIES SURGICAL .5 X1 (DISPOSABLE) ×1 IMPLANT
PATTIES SURGICAL 1X1 (DISPOSABLE) ×1 IMPLANT
SPACER ALIF MOD 6X34X24 10D (Spacer) IMPLANT
SPIKE FLUID TRANSFER (MISCELLANEOUS) ×1 IMPLANT
SPONGE INTESTINAL PEANUT (DISPOSABLE) ×2 IMPLANT
SPONGE SURGIFOAM ABS GEL 100 (HEMOSTASIS) ×1 IMPLANT
SPONGE T-LAP 18X18 ~~LOC~~+RFID (SPONGE) ×1 IMPLANT
SUT PDS AB 1 CTX 36 (SUTURE) ×1 IMPLANT
SUT PROLENE 4-0 RB1 .5 CRCL 36 (SUTURE) IMPLANT
SUT PROLENE 5 0 CC1 (SUTURE) IMPLANT
SUT PROLENE 6 0 C 1 30 (SUTURE) IMPLANT
SUT PROLENE 6 0 CC (SUTURE) IMPLANT
SUT SILK 0 TIES 10X30 (SUTURE) IMPLANT
SUT SILK 2 0 TIES 10X30 (SUTURE) ×1 IMPLANT
SUT SILK 2 0SH CR/8 30 (SUTURE) IMPLANT
SUT SILK 3 0SH CR/8 30 (SUTURE) IMPLANT
SUT SILK 3-0 18XBRD TIE BLK (SUTURE) IMPLANT
SUT VIC AB 0 CT1 27XBRD ANBCTR (SUTURE) IMPLANT
SUT VIC AB 1 CT1 18XBRD ANBCTR (SUTURE) ×1 IMPLANT
SUT VIC AB 2-0 CP2 18 (SUTURE) ×1 IMPLANT
SUT VIC AB 3-0 SH 8-18 (SUTURE) ×1 IMPLANT
SUT VIC AB 4-0 RB1 18 (SUTURE) ×1 IMPLANT
SYR 30ML SLIP (SYRINGE) ×1 IMPLANT
TOWEL GREEN STERILE (TOWEL DISPOSABLE) ×1 IMPLANT
TOWEL GREEN STERILE FF (TOWEL DISPOSABLE) IMPLANT
TRAY FOLEY MTR SLVR 14FR STAT (SET/KITS/TRAYS/PACK) IMPLANT
TRAY FOLEY MTR SLVR 16FR STAT (SET/KITS/TRAYS/PACK) ×1 IMPLANT
WATER STERILE IRR 1000ML POUR (IV SOLUTION) ×1 IMPLANT

## 2023-08-21 NOTE — Transfer of Care (Signed)
 Immediate Anesthesia Transfer of Care Note  Patient: Heidi Gross  Procedure(s) Performed: ANTERIOR LUMBAR INTERBODY FUSION LUMBAR FOUR-LUMBAR FIVE ABDOMINAL EXPOSURE  Patient Location: PACU  Anesthesia Type:General  Level of Consciousness: awake, alert , and oriented  Airway & Oxygen Therapy: Patient connected to face mask oxygen  Post-op Assessment: Post -op Vital signs reviewed and stable  Post vital signs: stable  Last Vitals:  Vitals Value Taken Time  BP 101/61 08/21/23 11:42  Temp    Pulse 86 08/21/23 11:44  Resp 19 08/21/23 11:44  SpO2 100 % 08/21/23 11:44  Vitals shown include unfiled device data.  Last Pain:  Vitals:   08/21/23 0623  TempSrc: Oral  PainSc:          Complications: There were no known notable events for this encounter.

## 2023-08-21 NOTE — H&P (Signed)
 Heidi Gross is an 53 y.o. female.   Chief Complaint: Severe unrelenting back pain with occasional radicular symptoms HPI: Patient is a 53 year old individual whose had episodic and severe back pain in the past he has had some modest disc degenerative changes at L4-L5 and these have been treated for several years with conservative efforts including epidural steroid injections various physical therapy exercise regimens in addition to intradiscal steroid injections which had been quite effective for several years.  Unfortunately the pain has continued to recur and most recently was unresponsive to her last injection.  After careful consideration of her options we discussed disc decompression at L4-L5 via an anterior interbody arthrodesis.  She is now admitted for that procedure.  Past Medical History:  Diagnosis Date   Anxiety    Arthritis    Blood transfusion without reported diagnosis    Dysrhythmia    Palpitations/SVT   Fibromyalgia    Narcolepsy without cataplexy    Neuromuscular disorder (HCC)    Other chronic cystitis without hematuria    Other intervertebral disc displacement, lumbar region    Palpitations     Past Surgical History:  Procedure Laterality Date   AUGMENTATION MAMMAPLASTY  2008   CESAREAN SECTION     1990, 1992, 2003, 2005   COLONOSCOPY     FRACTURE SURGERY Left    Steel Plate lateral left lower leg   LASIK     TONSILLECTOMY     As a child    Family History  Problem Relation Age of Onset   Heart disease Mother    Alcohol abuse Father    Diabetes Maternal Uncle    Arthritis Maternal Grandmother    Cancer Maternal Grandfather    Social History:  reports that she has quit smoking. Her smoking use included cigarettes. She has never used smokeless tobacco. She reports that she does not currently use alcohol. She reports that she does not use drugs.  Allergies:  Allergies  Allergen Reactions   Sulfa Antibiotics Other (See Comments) and Dermatitis     Medications Prior to Admission  Medication Sig Dispense Refill   cephALEXin  (KEFLEX ) 250 MG capsule Take 250 mg by mouth every evening.     clobetasol (TEMOVATE) 0.05 % external solution Apply 1 Application topically daily as needed (irritation).     DULoxetine  (CYMBALTA ) 60 MG capsule Take 60 mg by mouth 2 (two) times daily.     estradiol  (ESTRACE ) 1 MG tablet Take 1 mg by mouth daily.     gabapentin  (NEURONTIN ) 100 MG capsule Take 300 mg by mouth every evening.     hydroxychloroquine  (PLAQUENIL ) 200 MG tablet Take 200 mg by mouth daily.     ketoconazole (NIZORAL) 2 % shampoo 1 Application 2 (two) times a week.     metoprolol  succinate (TOPROL -XL) 25 MG 24 hr tablet Take 25 mg by mouth daily.     progesterone  (PROMETRIUM ) 100 MG capsule Take 100 mg by mouth every evening.     Sodium Oxybate  (XYREM ) 500 MG/ML SOLN 3.75 g twice nightly (Patient taking differently: Take 3.75 mg by mouth See admin instructions. 3.75 g twice nightly at 10p and 130a) 180 mL 5   UNABLE TO FIND Take 1 Package by mouth 2 (two) times daily. Vitamin Pack      Results for orders placed or performed during the hospital encounter of 08/21/23 (from the past 48 hours)  ABO/Rh     Status: None (Preliminary result)   Collection Time: 08/21/23  7:00 AM  Result Value Ref Range   ABO/RH(D) PENDING    No results found.  Review of Systems  Constitutional:  Positive for activity change.  Musculoskeletal:  Positive for back pain and gait problem.  All other systems reviewed and are negative.   Blood pressure 101/70, pulse (!) 57, temperature 98 F (36.7 C), temperature source Oral, resp. rate 18, height 5' 3 (1.6 m), weight 52.2 kg, SpO2 100%. Physical Exam Constitutional:      Appearance: Normal appearance. She is normal weight.  HENT:     Head: Normocephalic and atraumatic.     Right Ear: Tympanic membrane, ear canal and external ear normal.     Left Ear: Tympanic membrane, ear canal and external ear normal.      Nose: Nose normal.     Mouth/Throat:     Mouth: Mucous membranes are moist.     Pharynx: Oropharynx is clear.  Eyes:     Extraocular Movements: Extraocular movements intact.     Conjunctiva/sclera: Conjunctivae normal.     Pupils: Pupils are equal, round, and reactive to light.  Cardiovascular:     Rate and Rhythm: Normal rate and regular rhythm.     Pulses: Normal pulses.     Heart sounds: Normal heart sounds.  Pulmonary:     Effort: Pulmonary effort is normal.     Breath sounds: Normal breath sounds.  Abdominal:     General: Abdomen is flat.     Palpations: Abdomen is soft.  Musculoskeletal:        General: Normal range of motion.     Cervical back: Normal range of motion and neck supple.  Skin:    General: Skin is warm and dry.     Capillary Refill: Capillary refill takes less than 2 seconds.  Neurological:     General: No focal deficit present.     Mental Status: She is alert.  Psychiatric:        Mood and Affect: Mood normal.        Behavior: Behavior normal.        Thought Content: Thought content normal.        Judgment: Judgment normal.      Assessment/Plan Degenerative disc disease L4-L5.  Plan anterior lumbar interbody arthrodesis L4-L5.  Victory JINNY Gens, MD 08/21/2023, 7:42 AM

## 2023-08-21 NOTE — Progress Notes (Signed)
 Orthopedic Tech Progress Note Patient Details:  Heidi Gross Jun 14, 1970 [id number]  Ortho Devices Type of Ortho Device: Lumbar corsett Ortho Device/Splint Location: Back Ortho Device/Splint Interventions: Ordered      [name] Peed 08/21/2023, 2:27 PM

## 2023-08-21 NOTE — Op Note (Signed)
 Date: August 21, 2023  Preoperative diagnosis: Chronic lower back pain  Postoperative diagnosis: Same  Procedure: Anterior retroperitoneal spine exposure for L4-L5 ALIF  Surgeon: Dr. Lonni DOROTHA Gaskins, MD  Co-surgeon: Dr. Victory Gens, MD  Indications: 53 year old female with severe chronic lower back pain.  She has degenerative disc changes at L4-L5 and has failed conservative management.  She presents for decompression and anterior lumbar interbody fusion.  Vascular surgery was asked to assist with anterior spine exposure and she presents after risks and benefits discussed.  Findings: The L4-L5 disc space was marked over the left rectus muscle with a fluoroscopic C arm in the lateral position.  A paramedian incision was made over the left rectus muscle and opened the anterior rectus sheath mobilized left rectus to the midline.  The posterior rectus sheath was opened above arcuate line after peritoneum was mobilized.  Mobilized peritoneum and left ureter across midline.  The left iliac artery and vein were identified.  I ligated several segmental branches as well as several iliolumbar branches.  The left iliac artery and vein were completely mobilized to the contralateral side of the disc space and we placed fixed NuVasive retractor at L4-L5. We got an image to confirm we were at the correct level.  Anesthesia: General  Details: Patient was taken to the operating room after informed consent was obtained.  Placed on the operative table in supine position.  General endotracheal anesthesia was induced.  Fluoroscopic C-arm was then used in the lateral position to mark the L4-L5 disc space over the left rectus muscle.  The abdominal wall was then prepped and draped in standard sterile fashion.  Antibiotics were given and timeout performed.  I went ahead and made a paramedian incision over our preoperative mark.  Ultimately this was dissected down through the subcutaneous tissue with Bovie cautery.   Cerebellar retractors were used.  The anterior rectus sheath was opened longitudinally with Bovie cautery.  I raised flaps underneath the anterior rectus sheath and the left rectus muscle was mobilized to the midline.  I then bluntly dissected peritoneum off of the posterior rectus sheath and opened posterior rectus sheath with Metzenbaum scissors to get up to the L4-L5 disc space.  The retroperitoneum was then entered and we mobilized peritoneum and left ureter to the midline using hand held Wiley retractors.  I placed a wet lap pad in the wound with a Balfour to get additional visualization.  I mobilized all the way across the left psoas to the left iliac artery and identified the left iliac vein.  I then ligated several segmental branches between 2-0 silk ties clips and these were divided and also ligated several iliolumbar branches between 2-0 silk ties clips and divided.  I then fully mobilized the left iliac artery and vein all the way across to the contralateral disc space.  We had good anterior exposure.  I brought a fixed NuVasive retractor on the field.  120 Reverse lips were placed on each side of the disc space with 100 reverse lips cranial and caudal.  We had good anterior exposure.  A needle was placed in the disc space.  We then got a lateral fluoroscopic image confirming with the correct level.  Case was turned over to Dr. Gens.  Complication: None  Condition: Stable  Lonni DOROTHA Gaskins, MD [facility] Office: [phone]   Lonni JINNY Gaskins

## 2023-08-21 NOTE — Anesthesia Postprocedure Evaluation (Signed)
 Anesthesia Post Note  Patient: Heidi Gross  Procedure(s) Performed: ANTERIOR LUMBAR INTERBODY FUSION LUMBAR FOUR-LUMBAR FIVE ABDOMINAL EXPOSURE     Patient location during evaluation: PACU Anesthesia Type: General Level of consciousness: awake and alert Pain management: pain level controlled Vital Signs Assessment: post-procedure vital signs reviewed and stable Respiratory status: spontaneous breathing, nonlabored ventilation, respiratory function stable and patient connected to nasal cannula oxygen Cardiovascular status: blood pressure returned to baseline and stable Postop Assessment: no apparent nausea or vomiting Anesthetic complications: no   There were no known notable events for this encounter.  Last Vitals:  Vitals:   08/21/23 1300 08/21/23 1322  BP: (!) 91/58 107/68  Pulse: 62 68  Resp: 10 19  Temp: 36.6 C 36.6 C  SpO2: 100% 100%    Last Pain:  Vitals:   08/21/23 1334  TempSrc:   PainSc: 4                  Jeffree Cazeau P Iysha Mishkin

## 2023-08-21 NOTE — H&P (Signed)
 History and Physical Interval Note:  08/21/2023 7:25 AM  Heidi Gross  has presented today for surgery, with the diagnosis of LUMBAR RADICULOPATHY, CHRONIC.  The various methods of treatment have been discussed with the patient and family. After consideration of risks, benefits and other options for treatment, the patient has consented to  Procedure(s) with comments: ANTERIOR LUMBAR FUSION 1 LEVEL (N/A) - ALIF L45 W/DR Elorah Dewing ABDOMINAL EXPOSURE (N/A) as a surgical intervention.  The patient's history has been reviewed, patient examined, no change in status, stable for surgery.  I have reviewed the patient's chart and labs.  Questions were answered to the patient's satisfaction.    Anterior spine exposure for L4-L5 ALIF.  CT reviewed and no duplicated cava - large ovarian vein.  Risk and benefits again discussed.    Heidi Gross     Patient name: Heidi Gross       MRN: [id number]        DOB: 05/22/1970          Sex: female   REASON FOR CONSULT: Abdominal exposure for L4-L5 ALIF   HPI: Heidi Gross is a 53 y.o. female, with history of chronic back pain that presents for evaluation of anterior spine exposure for L4-L5 ALIF and is followed by Dr. Colon.  Patient describes at least 3 years of pain mostly with right leg radiculopathy.  She has failed conservative management.  Was very active doing CrossFit and has had to limit her activity level.  Now does yoga on a regular basis.  Previous abdominal surgery includes 4 C-sections.             Past Medical History:  Diagnosis Date   Anxiety     Blood transfusion without reported diagnosis     Fibromyalgia     Narcolepsy without cataplexy     Neuromuscular disorder (HCC)     Other chronic cystitis without hematuria     Other intervertebral disc displacement, lumbar region     Palpitations                 Past Surgical History:  Procedure Laterality Date   AUGMENTATION MAMMAPLASTY       CESAREAN SECTION                    Family History  Problem Relation Age of Onset   Heart disease Mother     Alcohol abuse Father     Diabetes Maternal Uncle     Arthritis Maternal Grandmother     Cancer Maternal Grandfather            SOCIAL HISTORY: Social History         Socioeconomic History   Marital status: Married      Spouse name: Not on file   Number of children: Not on file   Years of education: Not on file   Highest education level: Not on file  Occupational History   Not on file  Tobacco Use   Smoking status: Former   Smokeless tobacco: Never  Vaping Use   Vaping status: Never Used  Substance and Sexual Activity   Alcohol use: Yes      Alcohol/week: 2.0 standard drinks of alcohol      Types: 2 Glasses of wine per week   Drug use: No   Sexual activity: Yes  Other Topics Concern   Not on file  Social History Narrative   Not on file    Social Drivers  of Health        Financial Resource Strain: Low Risk  (11/05/2022)    Received from [facility]    Overall Financial Resource Strain (CARDIA)     Difficulty of Paying Living Expenses: Not hard at all  Food Insecurity: No Food Insecurity (11/05/2022)    Received from [facility]    Hunger Vital Sign     Within the past 12 months, you worried that your food would run out before you got the money to buy more.: Never true     Within the past 12 months, the food you bought just didn't last and you didn't have money to get more.: Never true  Transportation Needs: No Transportation Needs (11/05/2022)    Received from [facility] - Transportation     Lack of Transportation (Medical): No     Lack of Transportation (Non-Medical): No  Physical Activity: Sufficiently Active (11/05/2022)    Received from [facility]    Exercise Vital Sign     On average, how many days per week do you engage in moderate to strenuous exercise (like a brisk walk)?: 5 days     On average, how many minutes do you engage in exercise at this  level?: 90 min  Stress: Stress Concern Present (11/05/2022)    Received from [facility] - Occupational Stress Questionnaire     Feeling of Stress : To some extent  Social Connections: Socially Integrated (11/05/2022)    Received from [facility]    Social Network     How would you rate your social network (family, work, friends)?: Good participation with social networks  Intimate Partner Violence: Not At Risk (11/05/2022)    Received from [facility]    HITS     Over the last 12 months how often did your partner physically hurt you?: Never     Over the last 12 months how often did your partner insult you or talk down to you?: Never     Over the last 12 months how often did your partner threaten you with physical harm?: Never     Over the last 12 months how often did your partner scream or curse at you?: Never      Allergies      Allergies  Allergen Reactions   Sulfa Antibiotics Other (See Comments) and Dermatitis   Misc. Sulfonamide Containing Compounds Dermatitis              Current Outpatient Medications  Medication Sig Dispense Refill   azithromycin (ZITHROMAX) 250 MG tablet Take by mouth.       cephALEXin  (KEFLEX ) 250 MG capsule Take 250 mg by mouth.       Cephalexin  250 MG tablet         clobetasol (TEMOVATE) 0.05 % external solution SMARTSIG:1 Application Topical 6 Times a Week       DULoxetine  (CYMBALTA ) 60 MG capsule         estradiol  (ESTRACE ) 1 MG tablet Take 1 mg by mouth daily.       folic acid (FOLVITE) 1 MG tablet         gabapentin  (NEURONTIN ) 100 MG capsule 2 capsules daily x 2 weeks then 3 capsules daily x 2 weeks then follow-up with rheumatologist       hydroxychloroquine  (PLAQUENIL ) 200 MG tablet Take 200 mg by mouth daily.       ketoconazole (NIZORAL) 2 % shampoo  Apply topically.       metoprolol  succinate (TOPROL -XL) 25 MG 24 hr tablet Take 25 mg by mouth daily.       predniSONE (DELTASONE) 10 MG tablet          progesterone  (PROMETRIUM ) 100 MG capsule         Sodium Oxybate  (XYREM ) 500 MG/ML SOLN 3.75 g twice nightly 180 mL 5   UNABLE TO FIND Take by mouth every evening. Vitamin Pack          No current facility-administered medications for this visit.        REVIEW OF SYSTEMS:  [X]  denotes positive finding, [ ]  denotes negative finding Cardiac   Comments:  Chest pain or chest pressure:      Shortness of breath upon exertion:      Short of breath when lying flat:      Irregular heart rhythm:             Vascular      Pain in calf, thigh, or hip brought on by ambulation:      Pain in feet at night that wakes you up from your sleep:       Blood clot in your veins:      Leg swelling:              Pulmonary      Oxygen at home:      Productive cough:       Wheezing:              Neurologic      Sudden weakness in arms or legs:       Sudden numbness in arms or legs:       Sudden onset of difficulty speaking or slurred speech:      Temporary loss of vision in one eye:       Problems with dizziness:              Gastrointestinal      Blood in stool:       Vomited blood:              Genitourinary      Burning when urinating:       Blood in urine:             Psychiatric      Major depression:              Hematologic      Bleeding problems:      Problems with blood clotting too easily:             Skin      Rashes or ulcers:             Constitutional      Fever or chills:          PHYSICAL EXAM: There were no vitals filed for this visit.   GENERAL: The patient is a well-nourished female, in no acute distress. The vital signs are documented above. CARDIAC: There is a regular rate and rhythm.  VASCULAR:  Bilateral femoral pulses palpable Bilateral DP pulses palpable PULMONARY: No respiratory distress. ABDOMEN: Soft and non-tender MUSCULOSKELETAL: There are no major deformities or cyanosis. NEUROLOGIC: No focal weakness or paresthesias are detected. SKIN:  There are no ulcers or rashes noted. PSYCHIATRIC: The patient has a normal affect.   DATA:    MRI lumbar spine 05/22/2023 with severe disc height loss at L4-L5.  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   Assessment/Plan:   53 y.o. female, with history of chronic back pain that presents for evaluation of anterior spine exposure for L4-L5 ALIF.  I discussed that I have reviewed her MRI.  I would like to get a CT abdomen pelvis with contrast as I have some question about whether she could have a duplicated IVC or other congenital anomaly and this would be important to acknowledge prior  to anterior spine exposure.  I discussed otherwise paramedian incision over the left rectus muscle and then mobilizing the rectus muscle to enter into the retroperitoneum and mobilized peritoneum and left ureter across midline.  Discussed mobilizing iliac artery and vein to get the disc space exposed from the front.  Discussed risk of injury to the above structures including vessel injury.  Discussed goal is to get the disc space exposed from the front for Dr. Colon.  She is currently scheduled for 08/21/2023.  I will review her CT next week after complete and contact her with any concerns.     Heidi DOROTHA Gaskins, MD [facility] [place] Office: [phone]

## 2023-08-21 NOTE — Anesthesia Procedure Notes (Signed)
 Procedure Name: Intubation Date/Time: 08/21/2023 8:04 AM  Performed by: Lockie Flesher, CRNAPre-anesthesia Checklist: Patient identified, Emergency Drugs available, Suction available and Patient being monitored Patient Re-evaluated:Patient Re-evaluated prior to induction Oxygen Delivery Method: Circle System Utilized Preoxygenation: Pre-oxygenation with 100% oxygen Induction Type: IV induction Ventilation: Mask ventilation without difficulty Laryngoscope Size: Mac and 3 Grade View: Grade II Tube type: Oral Tube size: 7.0 mm Number of attempts: 1 Airway Equipment and Method: Stylet and Oral airway Placement Confirmation: ETT inserted through vocal cords under direct vision, positive ETCO2 and breath sounds checked- equal and bilateral Secured at: 22 cm Tube secured with: Tape Dental Injury: Teeth and Oropharynx as per pre-operative assessment

## 2023-08-21 NOTE — Op Note (Signed)
 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 Medford Gaskins, MD Anesthesia: General endotracheal Indications: Heidi Gross is a 53 year old individual whose had significant back and lower extremity pain.  She has evidence of advanced disc degenerative changes at L4-L5 has failed extensive conservative management.  She was advised regarding a decompressive procedure with stabilization of the L4-5 joint.  Procedure: The patient was brought to the operating room supine on the stretcher.  After the smooth induction of general endotracheal anesthesia she was placed supine on the operating table.  Fluoroscopic guidance was used to localize the L4-L5 level ventrally.  The skin was marked.  Skin was then prepped with alcohol DuraPrep and draped in a sterile fashion.  Dr. Medford Gaskins performed the approach to the retroperitoneal space using a vertical incision.  Once retractors were set identifying the L4-5 space positively with a radiograph I then removed ventral osteophytic material from the area at L4-L5.  The space was identified.  Using a high-speed bit and the matchstick bur I was able to open the disc space slightly to allow placement of a disc space spreader.  Then substantial severely desiccated and degenerated disc material was removed and the endplates were also removed.  Dissection was carried laterally and inferiorly until the posterior longitudinal ligament was identified.  As the ligament was opened on either side some distraction was allowed.  Ultimately we decompressed the extraforaminal zone at L4-5 on the right side where there was substantial extraforaminal disc protruding and also the subligamentous space behind the vertebral body of L4 on the left side superior where there was  some sequestered disc material.  Once the decompression was completed the interspace was sized for an appropriately sized spacer which was a new Nu[employer] modulus ALIF spacer measuring 6 x 34 x 24 mm with 10 degrees of lordosis.  420 mm screws were then used to anchor this spacer into position it was filled with Osteocel that was mixed with 2-1/2 cc of protein's.  Additional graft was then packed outside the spacer.  Final radiographic confirmation of its position was verified and once verified we then remove the retractors hemostasis was carefully obtained and then the anterior rectus sheath was closed with 0 Novafil suture in a running fashion.  The subcutaneous tissue was closed with 2-0 Vicryl in interrupted fashion and 3-0 Vicryl and 4-0 Vicryl was used in the subcuticular skin Dermabond was placed on the skin.  Blood loss for the procedure was estimated at less than 100 cc.

## 2023-08-21 NOTE — Progress Notes (Signed)
 Patient ID: Heidi Gross, female   DOB: [birth year], 53 y.o.   MRN: [id number] Alert and awake with some moderate soreness in her tummy and her back.  Motor function is good in her lower extremities.  She has been out of bed already.  Will see how she does and hopeful for discharge tomorrow

## 2023-08-22 ENCOUNTER — Encounter (HOSPITAL_COMMUNITY): Payer: Self-pay | Admitting: Neurological Surgery

## 2023-08-22 DIAGNOSIS — Z981 Arthrodesis status: Secondary | ICD-10-CM

## 2023-08-22 DIAGNOSIS — Z9889 Other specified postprocedural states: Secondary | ICD-10-CM

## 2023-08-22 MED ORDER — METHOCARBAMOL 500 MG PO TABS: 500.0000 mg | ORAL_TABLET | Freq: Four times a day (QID) | ORAL | 2 refills | Status: AC | PRN

## 2023-08-22 MED ORDER — OXYCODONE-ACETAMINOPHEN 5-325 MG PO TABS: 1.0000 | ORAL_TABLET | ORAL | 0 refills | Status: DC | PRN

## 2023-08-22 NOTE — Progress Notes (Addendum)
  Progress Note    08/22/2023 6:48 AM 1 Day Post-Op  Subjective:  no complaints  Tm 99.3 now afebrile  Vitals:   08/22/23 0338 08/22/23 0628  BP: (!) 94/59 (!) 106/59  Pulse: (!) 57 67  Resp: 18   Temp: 98.5 F (36.9 C)   SpO2: 100%     Physical Exam: General:  no distress Cardiac:  regular Lungs:  non labored Incisions:  clean with dermabond in tact Extremities:  palpable DP pulses bilaterally   CBC    Component Value Date/Time   WBC 10.1 08/21/2023 1330   RBC 3.64 (L) 08/21/2023 1330   HGB 11.7 (L) 08/21/2023 1330   HGB 13.8 12/26/2017 1430   HCT 34.3 (L) 08/21/2023 1330   HCT 41.0 12/26/2017 1430   PLT 133 (L) 08/21/2023 1330   PLT 212 12/26/2017 1430   MCV 94.2 08/21/2023 1330   MCV 94 12/26/2017 1430   MCH 32.1 08/21/2023 1330   MCHC 34.1 08/21/2023 1330   RDW 11.8 08/21/2023 1330   RDW 11.7 (L) 12/26/2017 1430   LYMPHSABS 1,361 05/21/2019 1022   LYMPHSABS 1.9 12/26/2017 1430   EOSABS 41 05/21/2019 1022   EOSABS 0.1 12/26/2017 1430   BASOSABS 41 05/21/2019 1022   BASOSABS 0.1 12/26/2017 1430    BMET    Component Value Date/Time   NA 136 08/15/2023 1413   NA 142 08/18/2020 1018   K 4.2 08/15/2023 1413   CL 99 08/15/2023 1413   CO2 28 08/15/2023 1413   GLUCOSE 98 08/15/2023 1413   BUN 16 08/15/2023 1413   BUN 11 08/18/2020 1018   CREATININE 0.85 08/21/2023 1330   CREATININE 0.87 05/21/2019 1022   CALCIUM 9.8 08/15/2023 1413   GFRNONAA >60 08/21/2023 1330   GFRAA 93 12/26/2017 1430    INR No results found for: INR   Intake/Output Summary (Last 24 hours) at 08/22/2023 9351 Last data filed at 08/21/2023 1420 Gross per 24 hour  Intake 2980 ml  Output 440 ml  Net 2540 ml      Assessment/Plan:  53 y.o. female is s/p:  ALIF exposure L5-S1  1 Day Post-Op   -pt doing well this am with palpable DP pulses bilaterally -most likely home today -ok to shower with soap and water and pat dry -f/u with vascular prn    Lucie Apt,  PA-C [facility] [phone] 08/22/2023 6:48 AM  I have seen and evaluated the patient. I agree with the PA note as documented above.  Postop day 1 status post anterior spine exposure for L4-5 ALIF.  Abdomen with appropriate postop incisional tenderness.  Palpable pedal pulses.  Overall looks good.  Lonni DOROTHA Gaskins, MD [facility] [place] Office: [phone]

## 2023-08-22 NOTE — Plan of Care (Signed)
 Pt doing well. Pt given D/C instructions with verbal understanding. Rx's were sent to the pharmacy by MD. Pt's incision is clean and dry with no sign of infection. Pt's IV was removed prior to D/C. Pt D/C'd home via wheelchair per MD order. Pt is stable @ D/C and has no other needs at this time. Rema Fendt, RN

## 2023-08-22 NOTE — Evaluation (Signed)
 Occupational Therapy Evaluation/Discharge Patient Details Name: Heidi Gross MRN: [id number] DOB: [birth year] Today's Date: 08/22/2023   History of Present Illness   Pt is a 53 y/o female admitted 7/7 for anterior approach L4-5 fusion in setting of advanced degenerative disease. PMH: anxiety, arthritis, fibromyalgia, neuromuscular disorder.     Clinical Impressions PTA, pt lives with spouse and typically completely independent with all daily tasks. Pt presents now with minor post op pain. Educated re: back precautions for ADLs, IADLs, bed mobility and LSO brace mgmt/wearing schedule. Pt able to manage all ADLs, in room mobility and LSO brace mgmt independently. Pt denies concerns. No further skilled OT services needed at this time.     If plan is discharge home, recommend the following:   Assist for transportation     Functional Status Assessment   Patient has not had a recent decline in their functional status     Equipment Recommendations   None recommended by OT     Recommendations for Other Services         Precautions/Restrictions   Precautions Precautions: Back Precaution Booklet Issued: Yes (comment) Recall of Precautions/Restrictions: Intact Required Braces or Orthoses: Spinal Brace Spinal Brace: Lumbar corset;Applied in sitting position Restrictions Weight Bearing Restrictions Per Provider Order: No     Mobility Bed Mobility Overal bed mobility: Modified Independent, Independent                  Transfers Overall transfer level: Independent Equipment used: None                      Balance Overall balance assessment: No apparent balance deficits (not formally assessed)                                         ADL either performed or assessed with clinical judgement   ADL Overall ADL's : Modified independent;Independent                                       General ADL Comments: Educated  re: back precautions for ADLs, IADLs, considerations to protect incision from dogs (husband boarding dogs during the day and pt plans to stay in bedroom from large dogs), bed mobility, LSO brace mgmt and wearing schedule.     Vision Ability to See in Adequate Light: 0 Adequate Patient Visual Report: No change from baseline Vision Assessment?: No apparent visual deficits;Wears glasses for reading     Perception         Praxis         Pertinent Vitals/Pain Pain Assessment Pain Assessment: Faces Faces Pain Scale: Hurts a little bit Pain Location: incision site Pain Descriptors / Indicators: Sore Pain Intervention(s): Monitored during session     Extremity/Trunk Assessment Upper Extremity Assessment Upper Extremity Assessment: Overall WFL for tasks assessed;Right hand dominant   Lower Extremity Assessment Lower Extremity Assessment: Defer to PT evaluation   Cervical / Trunk Assessment Cervical / Trunk Assessment: Back Surgery   Communication Communication Communication: No apparent difficulties   Cognition Arousal: Alert Behavior During Therapy: WFL for tasks assessed/performed Cognition: No apparent impairments                               Following commands:  Intact       Cueing  General Comments   Cueing Techniques: Verbal cues      Exercises     Shoulder Instructions      Home Living Family/patient expects to be discharged to:: Private residence Living Arrangements: Spouse/significant other Available Help at Discharge: Family Type of Home: House Home Access: Stairs to enter [employer] of Steps: 2   Home Layout: Two level;Able to live on main level with bedroom/bathroom     Bathroom Shower/Tub: Producer, television/film/video: Standard     Home Equipment: Hand held shower head          Prior Functioning/Environment Prior Level of Function : Independent/Modified Independent;Driving             Mobility  Comments: no AD ADLs Comments: Indep with ADLs, IADLs    OT Problem List:     OT Treatment/Interventions:        OT Goals(Current goals can be found in the care plan section)   Acute Rehab OT Goals Patient Stated Goal: home today OT Goal Formulation: All assessment and education complete, DC therapy   OT Frequency:       Co-evaluation              AM-PAC OT 6 Clicks Daily Activity     Outcome Measure Help from another person eating meals?: None Help from another person taking care of personal grooming?: None Help from another person toileting, which includes using toliet, bedpan, or urinal?: None Help from another person bathing (including washing, rinsing, drying)?: None Help from another person to put on and taking off regular upper body clothing?: None Help from another person to put on and taking off regular lower body clothing?: None 6 Click Score: 24   End of Session Equipment Utilized During Treatment: Back brace Nurse Communication: Mobility status  Activity Tolerance: Patient tolerated treatment well Patient left: in bed;with call bell/phone within reach  OT Visit Diagnosis: Other abnormalities of gait and mobility (R26.89)                Time: 9249-9189 OT Time Calculation (min): 20 min Charges:  OT General Charges $OT Visit: 1 Visit OT Evaluation $OT Eval Low Complexity: 1 Low  Mliss NOVAK, OTR/L Acute Rehab Services Office: [phone]   Mliss Fish 08/22/2023, 8:39 AM

## 2023-08-22 NOTE — Discharge Summary (Signed)
 Physician Discharge Summary  Patient ID: Heidi Gross MRN: [id number] DOB/AGE: 10-15-1970 53 y.o.  Admit date: 08/21/2023 Discharge date: 08/22/2023  Admission Diagnoses:degenerative disc disease L4-L5  Discharge Diagnoses: degenerative disc disease L4-L5 Principal Problem:   Degenerative disc disease, lumbar   Discharged Condition: good  Hospital Course: patient tolerated anterior cervical decompression and fusion well  Consults: None  Significant Diagnostic Studies: none  Treatments: surgery: see operative note  Discharge Exam: Blood pressure (!) 96/59, pulse 64, temperature 98.2 F (36.8 C), temperature source Oral, resp. rate 16, height 5' 3 (1.6 m), weight 52.2 kg, SpO2 100%. Incision is clean and dry station and gait are intact.  Disposition: Discharge disposition: 01-Home or Self Care       Discharge Instructions     Call MD for:  redness, tenderness, or signs of infection (pain, swelling, redness, odor or green/yellow discharge around incision site)   Complete by: As directed    Call MD for:  severe uncontrolled pain   Complete by: As directed    Call MD for:  temperature >100.4   Complete by: As directed    Diet - low sodium heart healthy   Complete by: As directed    Discharge wound care:   Complete by: As directed    Okay to shower. Do not apply salves or ointments to incision. No heavy lifting with the upper extremities greater than 10 pounds. May resume driving when not requiring pain medication and patient feels comfortable with doing so.   Incentive spirometry RT   Complete by: As directed    Increase activity slowly   Complete by: As directed       Allergies as of 08/22/2023       Reactions   Sulfa Antibiotics Other (See Comments), Dermatitis        Medication List     TAKE these medications    cephALEXin  250 MG capsule Commonly known as: KEFLEX  Take 250 mg by mouth every evening.   clobetasol 0.05 % external solution Commonly  known as: TEMOVATE Apply 1 Application topically daily as needed (irritation).   DULoxetine  60 MG capsule Commonly known as: CYMBALTA  Take 60 mg by mouth 2 (two) times daily.   estradiol  1 MG tablet Commonly known as: ESTRACE  Take 1 mg by mouth daily.   gabapentin  100 MG capsule Commonly known as: NEURONTIN  Take 300 mg by mouth every evening.   hydroxychloroquine  200 MG tablet Commonly known as: PLAQUENIL  Take 200 mg by mouth daily.   ketoconazole 2 % shampoo Commonly known as: NIZORAL 1 Application 2 (two) times a week.   methocarbamol  500 MG tablet Commonly known as: ROBAXIN  Take 1 tablet (500 mg total) by mouth every 6 (six) hours as needed for muscle spasms.   metoprolol  succinate 25 MG 24 hr tablet Commonly known as: TOPROL -XL Take 25 mg by mouth daily.   oxyCODONE -acetaminophen  5-325 MG tablet Commonly known as: PERCOCET/ROXICET Take 1 tablet by mouth every 4 (four) hours as needed for moderate pain (pain score 4-6) or severe pain (pain score 7-10).   progesterone  100 MG capsule Commonly known as: PROMETRIUM  Take 100 mg by mouth every evening.   Sodium Oxybate  500 MG/ML Soln Commonly known as: Xyrem  3.75 g twice nightly What changed:  how much to take how to take this when to take this additional instructions   UNABLE TO FIND Take 1 Package by mouth 2 (two) times daily. Vitamin Pack  Discharge Care Instructions  (From admission, onward)           Start     Ordered   08/22/23 0000  Discharge wound care:       Comments: Okay to shower. Do not apply salves or ointments to incision. No heavy lifting with the upper extremities greater than 10 pounds. May resume driving when not requiring pain medication and patient feels comfortable with doing so.   08/22/23 1036             Signed: Victory PARAS Suttyn Cryder 08/22/2023, 10:37 AM

## 2023-08-22 NOTE — Evaluation (Signed)
 Physical Therapy Evaluation and DISCHARGE  Patient Details Name: Heidi Gross MRN: [id number] DOB: 06-27-1970 Today's Date: 08/22/2023  History of Present Illness  Pt is a 53 y/o female admitted 7/7 for anterior approach L4-5 fusion in setting of advanced degenerative disease. PMH: anxiety, arthritis, fibromyalgia, neuromuscular disorder.  Clinical Impression  Pt admitted for back surgery. Pt's lumbar corset adjusted for optimal support. Pt with good return demonstration on how to don/doff brace in sitting, adherence and recall of back precautions, able to amb without AD safely, and completed necessary stair negotiation to enter home. Pt mobilizing well. Recommending no PT follow up at this time and touch base with surgeon at follow up. Pt educated on safe care transfers as well. Pt with no further acute PT needs at this time. PT SIGNING OFF. Please re-consult if needed in future.        If plan is discharge home, recommend the following: A little help with bathing/dressing/bathroom   Can travel by private vehicle        Equipment Recommendations None recommended by PT  Recommendations for Other Services       Functional Status Assessment Patient has had a recent decline in their functional status and/or demonstrates limited ability to make significant improvements in function in a reasonable and predictable amount of time     Precautions / Restrictions Precautions Precautions: Back Precaution Booklet Issued: Yes (comment) Recall of Precautions/Restrictions: Intact Required Braces or Orthoses: Spinal Brace Spinal Brace: Lumbar corset;Applied in sitting position (removed extender pieces to optimize fit as pt with small waist) Restrictions Weight Bearing Restrictions Per Provider Order: No      Mobility  Bed Mobility Overal bed mobility: Modified Independent, Independent             General bed mobility comments: able to return demo log roll technique     Transfers Overall transfer level: Independent Equipment used: None               General transfer comment: guarded    Ambulation/Gait Ambulation/Gait assistance: Supervision Gait Distance (Feet): 400 Feet Assistive device: None Gait Pattern/deviations: Step-through pattern, Decreased stride length Gait velocity: wfl for surgery Gait velocity interpretation: 1.31 - 2.62 ft/sec, indicative of limited community ambulator   General Gait Details: able to maintain upright posture, guarded, shorter step length compared to baseline but steady, no LOB  Stairs Stairs: Yes Stairs assistance: Min assist Stair Management: Step to pattern, Forwards (L HHA) Number of Stairs: 3 General stair comments: completed 3 steps with L HHA to mimic no hand rail at home. Pt return demonstrated up with the good (L LE), down with the bad R LE)  Wheelchair Mobility     Tilt Bed    Modified Rankin (Stroke Patients Only)       Balance Overall balance assessment: Mild deficits observed, not formally tested                                           Pertinent Vitals/Pain Pain Assessment Pain Assessment: 0-10 Pain Score: 3  Pain Location: incision site Pain Descriptors / Indicators: Sore Pain Intervention(s): Monitored during session    Home Living Family/patient expects to be discharged to:: Private residence Living Arrangements: Spouse/significant other Available Help at Discharge: Family Type of Home: House Home Access: Stairs to enter   Secretary/administrator of Steps: 2   Home Layout: Two level;Able  to live on main level with bedroom/bathroom Home Equipment: Hand held shower head      Prior Function Prior Level of Function : Independent/Modified Independent;Driving             Mobility Comments: no AD ADLs Comments: Indep with ADLs, IADLs     Extremity/Trunk Assessment   Upper Extremity Assessment Upper Extremity Assessment: Overall WFL for  tasks assessed    Lower Extremity Assessment Lower Extremity Assessment: RLE deficits/detail RLE Deficits / Details: grossly 3+/5 compared to L, pt reports mild numbness    Cervical / Trunk Assessment Cervical / Trunk Assessment: Back Surgery  Communication   Communication Communication: No apparent difficulties    Cognition Arousal: Alert Behavior During Therapy: WFL for tasks assessed/performed                             Following commands: Intact       Cueing Cueing Techniques: Verbal cues     General Comments General comments (skin integrity, edema, etc.): VSS, incision without drainage and covered by dressing    Exercises Other Exercises Other Exercises: educated on isometric core contractions in hooklying position   Assessment/Plan    PT Assessment Patient does not need any further PT services  PT Problem List         PT Treatment Interventions      PT Goals (Current goals can be found in the Care Plan section)  Acute Rehab PT Goals Patient Stated Goal: home PT Goal Formulation: All assessment and education complete, DC therapy    Frequency       Co-evaluation               AM-PAC PT 6 Clicks Mobility  Outcome Measure Help needed turning from your back to your side while in a flat bed without using bedrails?: None Help needed moving from lying on your back to sitting on the side of a flat bed without using bedrails?: None Help needed moving to and from a bed to a chair (including a wheelchair)?: None Help needed standing up from a chair using your arms (e.g., wheelchair or bedside chair)?: None Help needed to walk in hospital room?: None Help needed climbing 3-5 steps with a railing? : A Little 6 Click Score: 23    End of Session Equipment Utilized During Treatment: Back brace Activity Tolerance: Patient tolerated treatment well Patient left: in bed;with call bell/phone within reach (sitting on EOB) Nurse Communication:  Mobility status PT Visit Diagnosis: Difficulty in walking, not elsewhere classified (R26.2)    Time: 0812-0830 PT Time Calculation (min) (ACUTE ONLY): 18 min   Charges:   PT Evaluation $PT Eval Low Complexity: 1 Low   PT General Charges $$ ACUTE PT VISIT: 1 Visit         Norene Ames, PT, DPT Acute Rehabilitation Services Secure chat preferred Office #: [phone]   Norene CHRISTELLA Ames 08/22/2023, 10:15 AM

## 2023-09-25 ENCOUNTER — Ambulatory Visit (INDEPENDENT_AMBULATORY_CARE_PROVIDER_SITE_OTHER): Admitting: Family Medicine

## 2023-09-25 ENCOUNTER — Other Ambulatory Visit: Payer: Self-pay

## 2023-09-25 VITALS — BP 126/82 | Ht 63.0 in | Wt 118.0 lb

## 2023-09-25 DIAGNOSIS — M25511 Pain in right shoulder: Secondary | ICD-10-CM | POA: Diagnosis not present

## 2023-09-25 DIAGNOSIS — G8929 Other chronic pain: Secondary | ICD-10-CM

## 2023-09-25 NOTE — Patient Instructions (Signed)
 You have a labral tear and subacromial bursitis. Try to avoid painful activities (overhead activities, lifting with extended arm) as much as possible. Naproxen twice a day with food for pain and inflammation. Can take tylenol in addition to this. Subacromial injection may be beneficial to help with pain and to decrease inflammation but would help the bursitis and not the tear Consider physical therapy with transition to home exercise program. Do home exercises daily. If not improving at follow-up we will consider further imaging, injection, and/or nitro patches. Follow up with me in 1 month.

## 2023-09-25 NOTE — Progress Notes (Signed)
 PCP: Samie Frederick, PA-C  Subjective:   HPI: Patient is a 53 y.o. female here for evaluation of right shoulder pain, she was last seen in clinic on 05/15/2023 for lumbar radiculopathy.  Patient says that she has been experiencing the pain in her right shoulder since 04/2023. She was working out and believes she may have injured her shoulder then. She says she has pain on the posterior aspect of her right shoulder and that the pain will sometimes shoot down her arm. She denies taking any medications to help with the pain and has not done exercises for it. For the most part she has just been trying to rest her right shoulder, however, after having back surgery on 08/21/2023 she has been having to use her right arm/shoulder more. She denies any history of right shoulder injury or trauma. Denies neck pain or stiffness.   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     clobetasol (TEMOVATE) 0.05 % external solution Apply 1 Application topically daily as needed (irritation).     DULoxetine  (CYMBALTA ) 60 MG capsule Take 60 mg by mouth 2 (two) times daily.     estradiol  (ESTRACE ) 1 MG tablet Take 1 mg by mouth daily.     gabapentin  (NEURONTIN ) 100 MG capsule Take 300 mg by mouth every evening.     hydroxychloroquine  (PLAQUENIL ) 200 MG tablet Take 200 mg by mouth daily.     ketoconazole (NIZORAL) 2 % shampoo 1 Application 2 (two) times a week.     methocarbamol  (ROBAXIN ) 500 MG tablet Take 1 tablet (500 mg total) by mouth every 6 (six) hours as  needed for muscle spasms. 30 tablet 2   metoprolol  succinate (TOPROL -XL) 25 MG 24 hr tablet Take 25 mg by mouth daily.     oxyCODONE -acetaminophen  (PERCOCET/ROXICET) 5-325 MG tablet Take 1 tablet by mouth every 4 (four) hours as needed for moderate pain (pain score 4-6) or severe pain (pain score 7-10). 30 tablet 0   progesterone  (PROMETRIUM ) 100 MG capsule Take 100 mg by mouth every evening.     Sodium Oxybate  (XYREM ) 500 MG/ML SOLN 3.75 g twice nightly (Patient taking differently: Take 3.75 mg by mouth See admin instructions. 3.75 g twice nightly at 10p and 130a) 180 mL 5   UNABLE TO FIND Take 1 Package by mouth 2 (two) times daily. Vitamin Pack     No current facility-administered medications on file prior to visit.    Past Surgical History:  Procedure Laterality Date   ABDOMINAL EXPOSURE N/A 08/21/2023   Procedure: ABDOMINAL EXPOSURE;  Surgeon: Gretta Lonni PARAS, MD;  Location: [facility] OR;  Service: Vascular;  Laterality: N/A;   ANTERIOR LUMBAR FUSION N/A 08/21/2023   Procedure: ANTERIOR LUMBAR INTERBODY FUSION LUMBAR FOUR-LUMBAR FIVE;  Surgeon: Colon Shove, MD;  Location: [facility] OR;  Service: Neurosurgery;  Laterality: N/A;   AUGMENTATION MAMMAPLASTY  2008   CESAREAN SECTION     1990, 1992, 2003, 2005   COLONOSCOPY     FRACTURE SURGERY  Left    Steel Plate lateral left lower leg   LASIK     TONSILLECTOMY     As a child    Allergies  Allergen Reactions   Sulfa Antibiotics Other (See Comments) and Dermatitis    BP 126/82   Ht 5' 3 (1.6 m)   Wt 118 lb (53.5 kg)   LMP  (LMP Unknown)   BMI 20.90 kg/m       No data to display              No data to display              Objective:  Physical Exam:  Gen: NAD, comfortable in exam room  MSK: Right shoulder  Inspection: There is mild scapular winging bilaterally. No appreciable soft tissue or bony abnormalities when compared with left shoulder.  Palpation: There is tenderness to palpation of the posterior aspect of the  head of the humerus. No tenderness to palpation of the clavicle, AC joint, or biceps tendon.  ROM: FROM with forward flexion and abduction/adduction. Internal and externa rotation slightly limited by pain.  Strength: 5/5 with internal/external rotation, flexion/extension, and bicep flexion. Patient reports pain with external opposed external rotation. Neuro/Vasc: Neurovascularly intact distally Special Tests: Empty can negative, O'Brien's negative, Hawkin's negative, Speed's negative, Crossover negative, Neer's minimally positive, and positive Sulcus sign  Limited MSK Ultrasound right shoulder: Biceps tendon appears normal.  Mild hypoechoic change in subscapularis.  At least degenerative change to posterior labrum vs tear.  Mild-mod subacromial bursitis.  Supraspinatus and infraspinatus appear intact.   Assessment & Plan:  1. Chronic right shoulder pain - Patient's right shoulder pain is likely due to some combination of mild subacromial bursitis and a labrum tear given her findings on exam and ultrasound today. Discussed that frozen shoulder is unlikely as her range of motion would likely be more significantly impacted. Ultrasound in clinic today revealed mild subscapular tendonopathy, slight damage to the labrum, and mild supraspinatus bursitis. Therefore, recommended treating with a combination of anti-inflammatory medications and physical therapy, with follow-up in 1 month.   - Aleve twice daily as needed for pain and inflammation, plus Tylenol  PRN  - Consider PT with transition to home exercises   - Avoid activities that provoke pain as much as possible  - If not improving, will consider further imaging, an injection, and/or nitroglycerin patches  - Follow-up in 1 month

## 2023-10-14 ENCOUNTER — Encounter: Payer: Self-pay | Admitting: Family Medicine

## 2023-10-25 ENCOUNTER — Ambulatory Visit (INDEPENDENT_AMBULATORY_CARE_PROVIDER_SITE_OTHER): Admitting: Family Medicine

## 2023-10-25 ENCOUNTER — Other Ambulatory Visit: Payer: Self-pay

## 2023-10-25 VITALS — BP 110/72 | Ht 63.0 in | Wt 118.0 lb

## 2023-10-25 DIAGNOSIS — G8929 Other chronic pain: Secondary | ICD-10-CM | POA: Diagnosis not present

## 2023-10-25 DIAGNOSIS — M25511 Pain in right shoulder: Secondary | ICD-10-CM

## 2023-10-25 MED ORDER — METHYLPREDNISOLONE ACETATE 40 MG/ML IJ SUSP
40.0000 mg | Freq: Once | INTRAMUSCULAR | Status: AC
  Administered 2023-10-25: 40 mg via INTRA_ARTICULAR

## 2023-10-25 NOTE — Patient Instructions (Signed)
 You have rotator cuff impingement Try to avoid painful activities (overhead activities, lifting with extended arm) as much as possible. Naproxen with food for pain and inflammation as needed. Can take tylenol  in addition to this. Subacromial injection may be beneficial to help with pain and to decrease inflammation - you were given this today. Consider physical therapy, nitroglycerin patches, MRI if not improving. Wait about 5-7 days before restarting the home exercises. Follow up with me in 1 month but call me sooner if you're struggling.

## 2023-10-27 NOTE — Progress Notes (Signed)
 PCP: Samie Frederick, PA-C  Subjective:   HPI: Patient is a 53 y.o. female here for right shoulder pain.  8/11: Patient says that she has been experiencing the pain in her right shoulder since 04/2023. She was working out and believes she may have injured her shoulder then. She says she has pain on the posterior aspect of her right shoulder and that the pain will sometimes shoot down her arm. She denies taking any medications to help with the pain and has not done exercises for it. For the most part she has just been trying to rest her right shoulder, however, after having back surgery on 08/21/2023 she has been having to use her right arm/shoulder more. She denies any history of right shoulder injury or trauma. Denies neck pain or stiffness.   9/10: Patient reports she's not made much improvement compared to last visit. Exercises with band were causing more pain. Doing motion exercises now instead. No new injuries. Pain lateral right shoulder.  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     clobetasol (TEMOVATE) 0.05 % external solution Apply 1 Application topically daily as needed (irritation).     DULoxetine  (CYMBALTA ) 60 MG capsule Take 60 mg by mouth 2 (two) times daily.     estradiol  (ESTRACE ) 1 MG tablet Take 1 mg by mouth daily.     gabapentin  (NEURONTIN ) 100 MG capsule Take 300 mg by mouth every evening.     hydroxychloroquine  (PLAQUENIL ) 200 MG tablet Take 200 mg by mouth daily.     ketoconazole (NIZORAL) 2 % shampoo 1  Application 2 (two) times a week.     methocarbamol  (ROBAXIN ) 500 MG tablet Take 1 tablet (500 mg total) by mouth every 6 (six) hours as needed for muscle spasms. 30 tablet 2   metoprolol  succinate (TOPROL -XL) 25 MG 24 hr tablet Take 25 mg by mouth daily.     oxyCODONE -acetaminophen  (PERCOCET/ROXICET) 5-325 MG tablet Take 1 tablet by mouth every 4 (four) hours as needed for moderate pain (pain score 4-6) or severe pain (pain score 7-10). 30 tablet 0   progesterone  (PROMETRIUM ) 100 MG capsule Take 100 mg by mouth every evening.     Sodium Oxybate  (XYREM ) 500 MG/ML SOLN 3.75 g twice nightly (Patient taking differently: Take 3.75 mg by mouth See admin instructions. 3.75 g twice nightly at 10p and 130a) 180 mL 5   UNABLE TO FIND Take 1 Package by mouth 2 (two) times daily. Vitamin Pack     No current facility-administered medications on file prior to visit.    Past Surgical History:  Procedure Laterality Date   ABDOMINAL EXPOSURE N/A 08/21/2023   Procedure: ABDOMINAL EXPOSURE;  Surgeon: Gretta Lonni PARAS, MD;  Location: [facility] OR;  Service: Vascular;  Laterality: N/A;   ANTERIOR LUMBAR FUSION N/A 08/21/2023   Procedure: ANTERIOR LUMBAR INTERBODY FUSION LUMBAR FOUR-LUMBAR FIVE;  Surgeon: Colon Shove, MD;  Location: [facility] OR;  Service: Neurosurgery;  Laterality: N/A;   AUGMENTATION MAMMAPLASTY  2008  CESAREAN SECTION     1990, 1992, 2003, 2005   COLONOSCOPY     FRACTURE SURGERY Left    Steel Plate lateral left lower leg   LASIK     TONSILLECTOMY     As a child    Allergies  Allergen Reactions   Sulfa Antibiotics Other (See Comments) and Dermatitis    BP 110/72   Ht 5' 3 (1.6 m)   Wt 118 lb (53.5 kg)   LMP  (LMP Unknown)   BMI 20.90 kg/m       No data to display              No data to display              Objective:  Physical Exam:  Gen: NAD, comfortable in exam room  Right shoulder: No swelling, ecchymoses.  No gross deformity. No TTP. FROM, painful arc  + Negative Hawkins, positive Neers. Negative Yergasons. Strength 5/5 with empty can and resisted internal/external rotation. NV intact distally.   Assessment & Plan:  1. Right shoulder pain - consistent with impingement.  Discussed options - subacromial injection given today.  Naproxen as needed.  Continue home exercises - should be able to return to strengthening exercises after 5-7 days.  Follow up in 1 month.  After informed written consent timeout was performed, patient was seated in chair in exam room. Right shoulder was prepped with alcohol swab and utilizing lateral approach with ultrasound guidance, patient's right subacromial space was injected with 3:1 lidocaine : depomedrol. Patient tolerated the procedure well without immediate complications.

## 2023-11-22 ENCOUNTER — Ambulatory Visit: Admitting: Family Medicine

## 2023-12-28 ENCOUNTER — Other Ambulatory Visit (HOSPITAL_COMMUNITY): Payer: Self-pay

## 2024-01-02 ENCOUNTER — Other Ambulatory Visit: Payer: Self-pay

## 2024-01-02 MED ORDER — SODIUM OXYBATE 500 MG/ML PO SOLN: ORAL | 4 refills | Status: AC

## 2024-01-02 NOTE — Telephone Encounter (Signed)
 Faxed refill request received from [facility].  Thank you!

## 2024-01-02 NOTE — Progress Notes (Deleted)
 Faxed refill request received.   Thank you!

## 2024-01-03 NOTE — Telephone Encounter (Signed)
 Heidi Gross from [facility]  stating that Pt need PA for medication Sodium Oxybate  (XYREM ) 500 MG/ML SOLN   CoverMyMed # [id number]  CALLBACK [phone]

## 2024-01-04 ENCOUNTER — Encounter: Payer: Self-pay | Admitting: Adult Health

## 2024-01-08 ENCOUNTER — Telehealth: Payer: Self-pay

## 2024-01-08 ENCOUNTER — Other Ambulatory Visit (HOSPITAL_COMMUNITY): Payer: Self-pay

## 2024-01-08 NOTE — Telephone Encounter (Signed)
 Prescription form signed by Dr Chalice. Form faxed to Xyrem  REMS program. Received a receipt of confirmation.  Patient updated.

## 2024-01-08 NOTE — Telephone Encounter (Signed)
 I don't see a problem switching- as long as patient is good with it

## 2024-01-08 NOTE — Telephone Encounter (Signed)
 I sent the patient a my chart message.

## 2024-01-08 NOTE — Telephone Encounter (Signed)
 Looks like brand to me after reviewing chart.

## 2024-01-08 NOTE — Telephone Encounter (Signed)
 Please advise

## 2024-01-08 NOTE — Telephone Encounter (Signed)
 Yes patient agreed (see other encounter). Will work on prescription form.

## 2024-01-08 NOTE — Telephone Encounter (Signed)
 Can you clarify if this is or BRAND Xyrem  or generic Sodium Oxybate . It looks like rx is for sodium oxybate  however per fill list has been getting the Brand. Please advise.

## 2024-01-08 NOTE — Telephone Encounter (Signed)
 Completed order form for generic Xyrem : sodium oxybate  3.75g twice nightly, ready for Dr Lionell signature.

## 2024-01-09 ENCOUNTER — Other Ambulatory Visit (HOSPITAL_COMMUNITY): Payer: Self-pay

## 2024-01-09 NOTE — Telephone Encounter (Signed)
 Pharmacy Patient Advocate Encounter   Received notification from Physician's Office that prior authorization for Sodium Oxybate  is required/requested.   Insurance verification completed.   The patient is insured through [employer].   Per test claim: PA required; PA started via CoverMyMeds. KEY [id number] . Waiting for clinical questions to populate.

## 2024-01-09 NOTE — Telephone Encounter (Signed)
 Clinical questions have been answered and PA submitted. PA currently Pending. Please be advised that most companies allow up to 30 days to make a decision. We will advise when a determination has been made, or follow up in 1 week.   Please reach out to our team, Rx Prior Auth Pool, if you haven't heard back in a week.

## 2024-01-10 ENCOUNTER — Other Ambulatory Visit (HOSPITAL_COMMUNITY): Payer: Self-pay

## 2024-01-10 ENCOUNTER — Telehealth: Payer: Self-pay

## 2024-01-10 NOTE — Telephone Encounter (Signed)
 Sure, the provider is ok with whichever one insurance will approve. We will just probably have to send in refills to the [facility] pharmacy to clarify brand vs generic once we have an approval. Thank you!

## 2024-01-10 NOTE — Telephone Encounter (Signed)
 Pharmacy Patient Advocate Encounter  Received notification from [employer] that Prior Authorization for Sodium Oxybate  has been DENIED.  Full denial letter will be uploaded to the media tab. See denial reason below.   PA #/Case ID/Reference #: [id number]  Thi is very confusing to me bc when I was submitting for brand it was asking why PT can not take generic. PT also stated in chart that insurance wanted her to switch to generic. Would you like for me to try PA for Brand?

## 2024-01-10 NOTE — Telephone Encounter (Signed)
 Pharmacy Patient Advocate Encounter   Received notification from Physician's Office that prior authorization for Xyrem  is required/requested.   Insurance verification completed.   The patient is insured through [employer].   Per test claim: PA required; PA submitted to above mentioned insurance via Latent Key/confirmation #/EOC [id number] Status is pending

## 2024-01-12 NOTE — Telephone Encounter (Signed)
 Received an additional Information form via fax-faxed completed form along with clinicals and sleep study to [employer].

## 2024-01-15 ENCOUNTER — Other Ambulatory Visit (HOSPITAL_COMMUNITY): Payer: Self-pay

## 2024-01-15 NOTE — Telephone Encounter (Signed)
 Pharmacy Patient Advocate Encounter  Received notification from [employer] that Prior Authorization for Xyrem  has been APPROVED from 01/12/2024 to 01/11/2025   PA #/Case ID/Reference #: [id number]/A     PA approved for BRAND.

## 2024-01-15 NOTE — Telephone Encounter (Signed)
 Xyrem  script redid for [facility] since PA approval. To sign.   Then to fax.

## 2024-01-16 NOTE — Telephone Encounter (Signed)
 Forms faxed back to Xyrem , confirmation received

## 2024-01-26 IMAGING — MG DIGITAL SCREENING BREAST BILAT IMPLANT W/ TOMO W/ CAD
9 of 12 series · 9 of 28 positions shown · non-contrast
Comparison: Previous exam(s).

CLINICAL DATA: Screening.

EXAM:
DIGITAL SCREENING BILATERAL MAMMOGRAM WITH IMPLANTS, CAD AND
TOMOSYNTHESIS
TECHNIQUE: Bilateral screening digital craniocaudal and mediolateral oblique
mammograms were obtained. Bilateral screening digital breast
tomosynthesis was performed. The images were evaluated with
computer-aided detection. Standard and/or implant displaced views
were performed.

[R MLO]
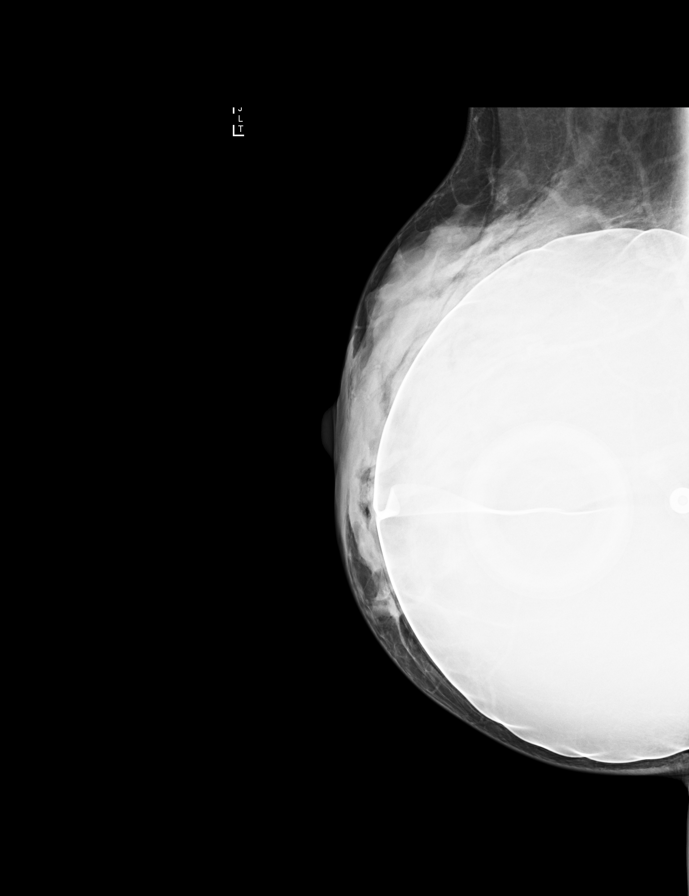

[R CC]
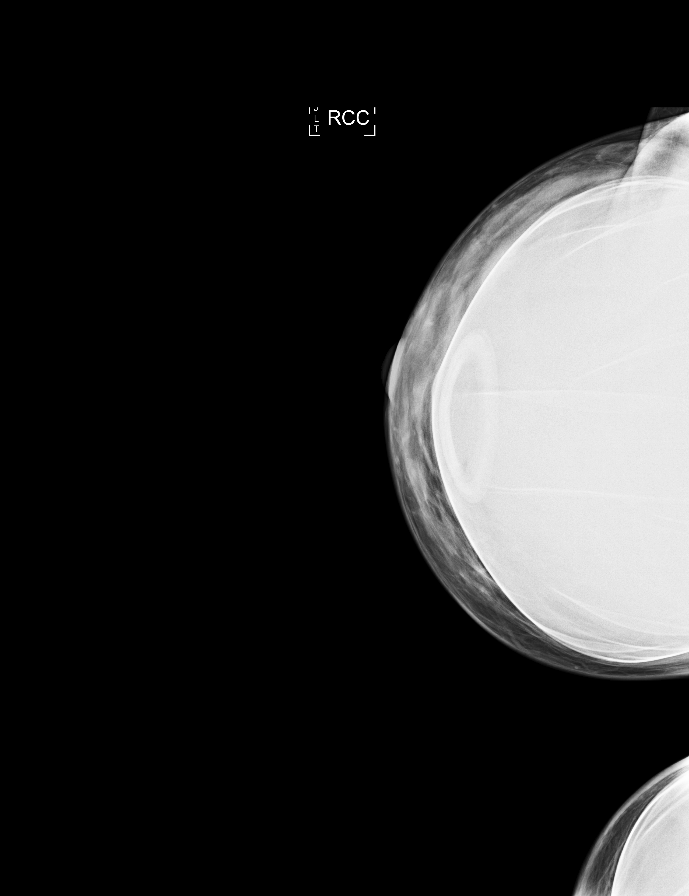

[L MLO]
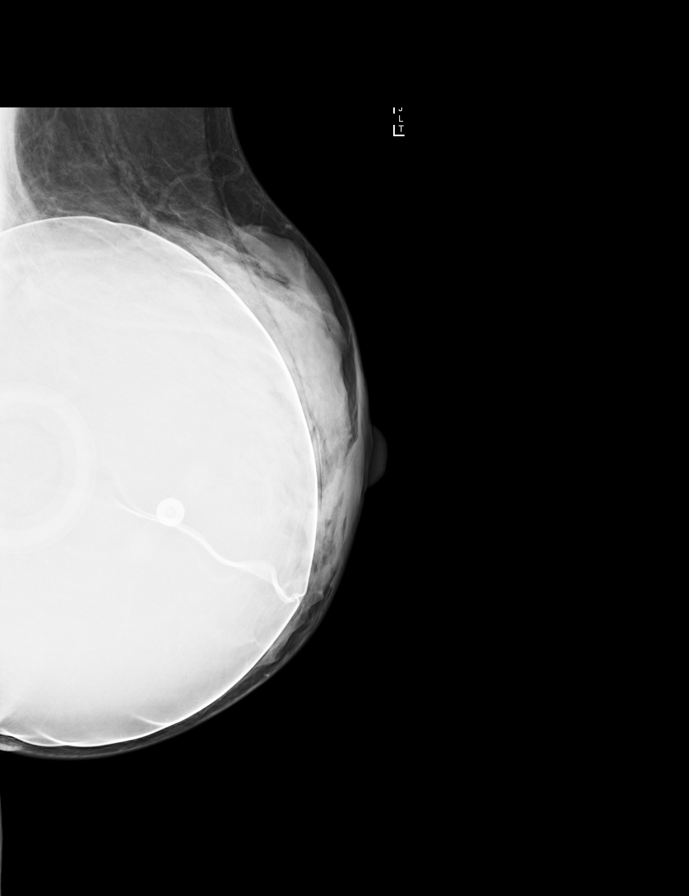

[L CC]
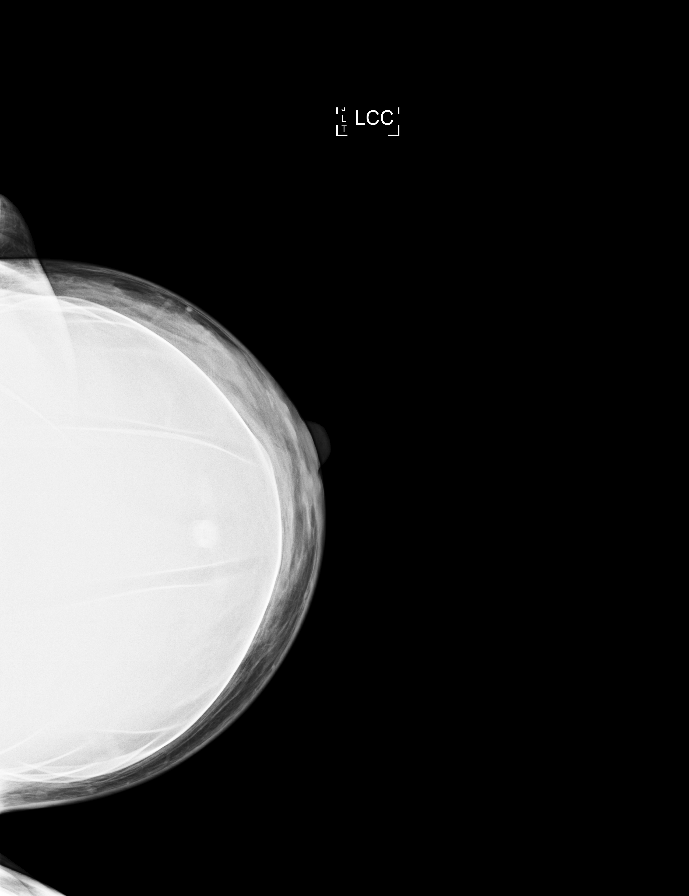

[L MLO synth-2D]
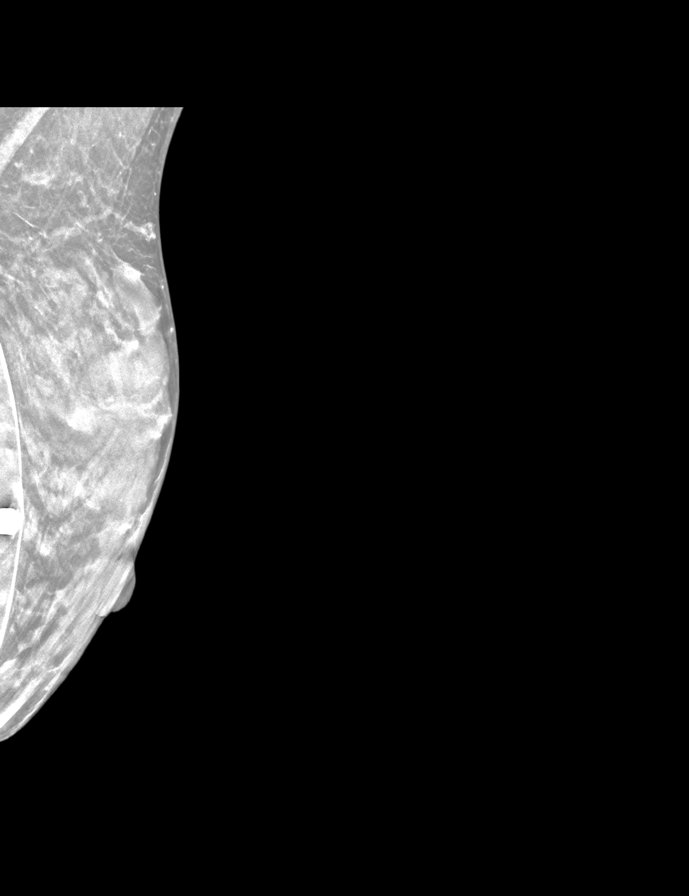

[R CC synth-2D]
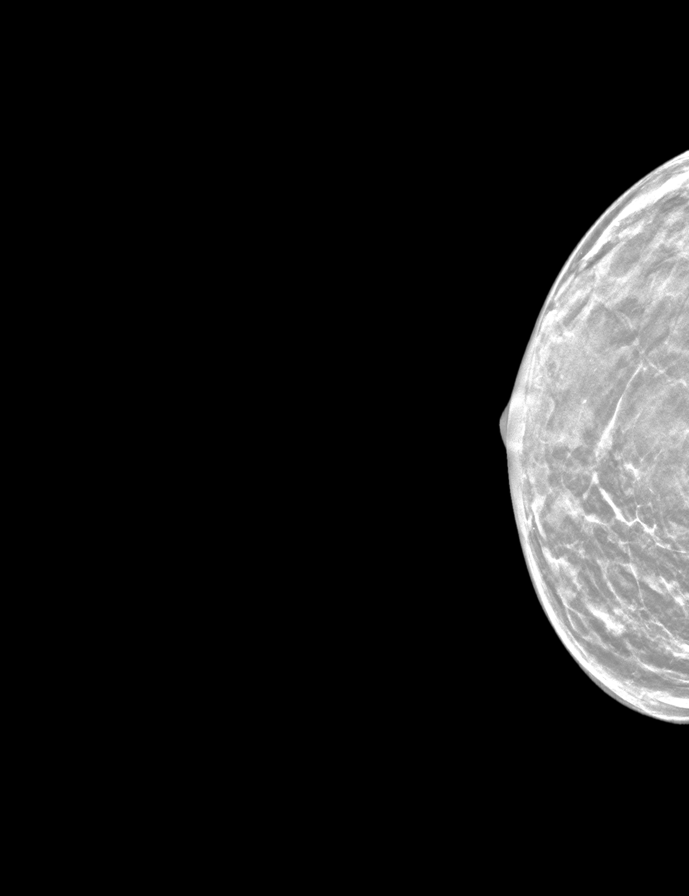

[R MLO synth-2D]
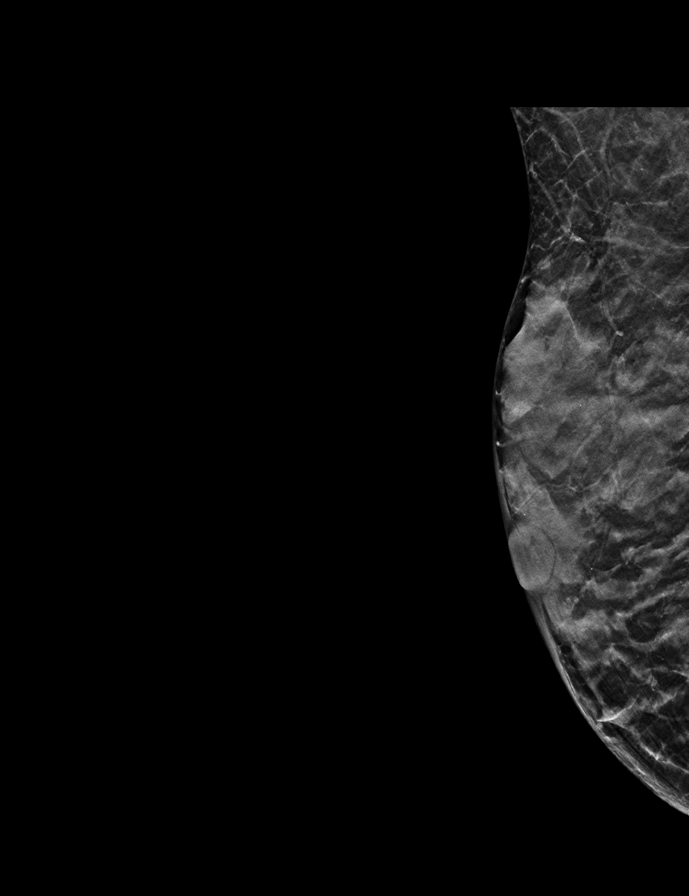

[L CC synth-2D]
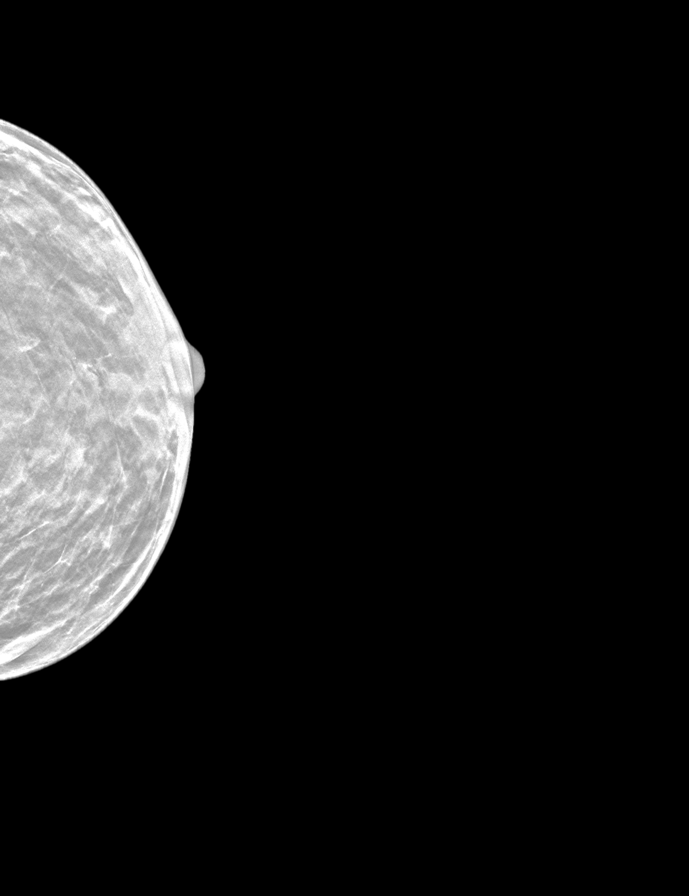

[R CCID BREAST TOMOSYNTHESIS IMAGE tomo · tomo slice 11/22.0]
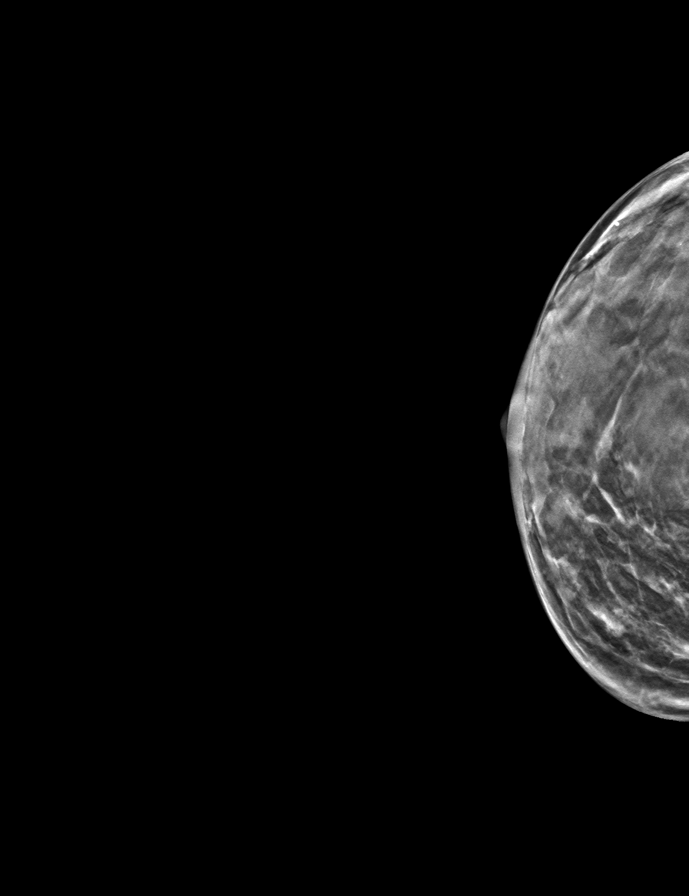

[9 of 28 positions shown; findings below may reference images not displayed]

ACR Breast Density Category d: The breast tissue is extremely dense,
which lowers the sensitivity of mammography.
FINDINGS: The patient has retropectoral implants. There are no findings
suspicious for malignancy.
IMPRESSION: No mammographic evidence of malignancy. A result letter of this
screening mammogram will be mailed directly to the patient.

RECOMMENDATION:
Screening mammogram in one year. (Code:GG-0-M5E)

BI-RADS CATEGORY  1:  Negative.

## 2024-02-01 ENCOUNTER — Ambulatory Visit: Admitting: Neurology

## 2024-02-20 ENCOUNTER — Ambulatory Visit: Admitting: Neurology

## 2024-02-21 ENCOUNTER — Encounter: Payer: Self-pay | Admitting: Audiologist

## 2024-02-21 ENCOUNTER — Ambulatory Visit: Attending: Physician Assistant | Admitting: Audiologist

## 2024-02-21 DIAGNOSIS — R42 Dizziness and giddiness: Secondary | ICD-10-CM | POA: Diagnosis present

## 2024-02-21 DIAGNOSIS — H903 Sensorineural hearing loss, bilateral: Secondary | ICD-10-CM | POA: Insufficient documentation

## 2024-02-21 NOTE — Procedures (Signed)
" °  [facility] [address] [place], [place] [address] [phone]  AUDIOLOGICAL EVALUATION  Name: Lillith Mcneff    Status: Outpatient DOB: August 16, 1970    Referent: Samie Tylene RIGGERS MRN: [id number] Date: 02/21/2024     Diagnosis: Sensorineural hearing loss, bilateral  Vertigo   HISTORY: [place], 54 y.o., was seen for an audiological evaluation.   Francesa notices increased difficulty hearing. Her family has also noticed that she is not hearing them.  Winry notes no ear pain or pressure. She has intermittent buzzing tinnitus, bilaterally.  Kamarii reports some history of noise exposure from concerts.  There is no report of family history of hearing loss, or history of ear surgery or ear infections. Pari has had vertigo off and on since 2022. She is currently experiencing vertigo whenever she turns her head to the left or gets out of bed.   EVALUATION: Otoscopic inspection reveals visible tympanic membranes. Right ear canal is clear and the left has minimal cerumen.  Tympanometry was completed to assess middle ear status. Normal, Type A tympanograms were obtained bilaterally.  Standard audiometric techniques were used to obtain thresholds under high frequency headphones. Speech reception thresholds are 10 dBHL on the right and 15 dBHL on the left using recorded spondee word lists. Word recognition was 96% at 50 dBHL on the right and 96% at 55 dBHL on the left using recorded NU-6 word lists, in quiet. A mild high frequency sensorineural hearing loss is present bilaterally.   CONCLUSION:  [place] has a mild high frequency sensorineural hearing loss bilaterally.  Word recognition is good in quiet at conversational speech levels bilaterally. Findings were reviewed with the patient.   RECOMMENDATIONS: Vestibular evaluation by an Ear, Nose and Throat physician.   Use of binaural amplification to assist in daily listening situations. A list of area hearing aid  providers was given to the patient. Use of communication strategies to improve communication in difficult listening situations.  33 minutes spent testing and counseling.  Delon EMERSON Baumgartner, Au.D., CCC-A Audiologist 02/21/2024  cc: Samie Tylene, PA-C  "

## 2024-04-16 ENCOUNTER — Ambulatory Visit: Admitting: Neurology

## 2024-05-21 ENCOUNTER — Ambulatory Visit: Admitting: Neurology
